# Patient Record
Sex: Female | Born: 1970
Health system: Southern US, Community
[De-identification: ages and names within clinical notes are randomized; demographics above are authoritative.]

## PROBLEM LIST (undated history)

## (undated) DIAGNOSIS — F32A Depression, unspecified: Secondary | ICD-10-CM

## (undated) DIAGNOSIS — I10 Essential (primary) hypertension: Secondary | ICD-10-CM

## (undated) DIAGNOSIS — M5412 Radiculopathy, cervical region: Secondary | ICD-10-CM

## (undated) DIAGNOSIS — E785 Hyperlipidemia, unspecified: Secondary | ICD-10-CM

## (undated) DIAGNOSIS — G5600 Carpal tunnel syndrome, unspecified upper limb: Secondary | ICD-10-CM

## (undated) DIAGNOSIS — J45909 Unspecified asthma, uncomplicated: Secondary | ICD-10-CM

## (undated) DIAGNOSIS — M5106 Intervertebral disc disorders with myelopathy, lumbar region: Secondary | ICD-10-CM

## (undated) DIAGNOSIS — K219 Gastro-esophageal reflux disease without esophagitis: Secondary | ICD-10-CM

## (undated) DIAGNOSIS — G894 Chronic pain syndrome: Secondary | ICD-10-CM

## (undated) DIAGNOSIS — M1990 Unspecified osteoarthritis, unspecified site: Secondary | ICD-10-CM

## (undated) DIAGNOSIS — E781 Pure hyperglyceridemia: Secondary | ICD-10-CM

## (undated) DIAGNOSIS — E119 Type 2 diabetes mellitus without complications: Secondary | ICD-10-CM

## (undated) DIAGNOSIS — G4733 Obstructive sleep apnea (adult) (pediatric): Secondary | ICD-10-CM

## (undated) HISTORY — DX: Obstructive sleep apnea (adult) (pediatric): G47.33

## (undated) HISTORY — DX: Hyperlipidemia, unspecified: E78.5

## (undated) HISTORY — DX: Chronic pain syndrome: G89.4

## (undated) HISTORY — DX: Depression, unspecified: F32.A

## (undated) HISTORY — DX: Gastro-esophageal reflux disease without esophagitis: K21.9

## (undated) HISTORY — DX: Type 2 diabetes mellitus without complications: E11.9

## (undated) HISTORY — DX: Unspecified osteoarthritis, unspecified site: M19.90

## (undated) HISTORY — PX: CHOLECYSTECTOMY: SHX55

## (undated) HISTORY — DX: Unspecified asthma, uncomplicated: J45.909

## (undated) HISTORY — DX: Essential (primary) hypertension: I10

## (undated) HISTORY — PX: KNEE SURGERY: SHX244

## (undated) HISTORY — DX: Intervertebral disc disorders with myelopathy, lumbar region: M51.06

## (undated) HISTORY — DX: Radiculopathy, cervical region: M54.12

## (undated) HISTORY — DX: Pure hyperglyceridemia: E78.1

## (undated) HISTORY — DX: Carpal tunnel syndrome, unspecified upper limb: G56.00

## (undated) HISTORY — PX: UTERINE FIBROID SURGERY: SHX826

---

## 2008-09-19 ENCOUNTER — Encounter
Admission: RE | Admit: 2008-09-19 | Discharge: 2008-09-19 | Payer: Self-pay | Admitting: Physical Medicine & Rehabilitation

## 2008-11-23 DIAGNOSIS — M5412 Radiculopathy, cervical region: Secondary | ICD-10-CM

## 2008-11-23 DIAGNOSIS — M5106 Intervertebral disc disorders with myelopathy, lumbar region: Secondary | ICD-10-CM

## 2008-11-23 DIAGNOSIS — G5600 Carpal tunnel syndrome, unspecified upper limb: Secondary | ICD-10-CM

## 2008-11-23 HISTORY — DX: Intervertebral disc disorders with myelopathy, lumbar region: M51.06

## 2008-11-23 HISTORY — DX: Carpal tunnel syndrome, unspecified upper limb: G56.00

## 2008-11-23 HISTORY — DX: Radiculopathy, cervical region: M54.12

## 2011-01-22 ENCOUNTER — Other Ambulatory Visit (HOSPITAL_COMMUNITY): Payer: Medicare Other

## 2011-01-24 ENCOUNTER — Inpatient Hospital Stay (HOSPITAL_COMMUNITY): Admission: RE | Admit: 2011-01-24 | Discharge: 2011-01-24 | Payer: Medicare Other | Source: Ambulatory Visit

## 2011-01-24 NOTE — Pre-Procedure Instructions (Signed)
20 Sarah Maldonado  01/24/2011   Your procedure is scheduled on: November 9th  Friday                                                                             Report to [facility] Short Stay Center at 5:30 AM.  Call this number if you have problems the morning of surgery: [phone]   Remember:   Do not eat food:After Midnight. Thursday  Do not drink clear liquids: 4 Hours before arrival (1:30am).  Take these medicines the morning of surgery with A SIP OF WATER:  Clonidine,              Maxidone and flexeril   Do not wear jewelry, make-up or nail polish .  Do not wear lotions, powders, or perfumes. You may wear deodorant.   Do not shave 48 hours prior to surgery.   Do not bring valuables to the hospital.   Contacts, dentures or bridgework may not be worn into surgery.   Leave suitcase in the car. After surgery it may be brought to your room.  For patients admitted to the hospital, checkout time is 11:00 AM the day of discharge.   Patients discharged the day of surgery will not be allowed to drive home.   Name and phone number of your driver: family  Special Instructions: CHG Shower Use Special Wash: 1/2 bottle night before surgery and 1/2 bottle morning of surgery...  Please read over the following fact sheets that you were given: Pain Booklet and MRSA Information

## 2011-01-29 ENCOUNTER — Inpatient Hospital Stay (HOSPITAL_COMMUNITY): Admission: RE | Admit: 2011-01-29 | Discharge: 2011-01-29 | Payer: Medicare Other | Source: Ambulatory Visit

## 2011-01-29 NOTE — Pre-Procedure Instructions (Signed)
20 Sarah Maldonado  01/29/2011   Your procedure is scheduled on: November 9TH--Friday                                                 Report to [facility] at 5:30 AM.  Call this number if you have problems the morning of surgery: [phone]   Remember:   Do not eat food:After Midnight.THURSDAY  Do not drink clear liquids: 4 Hours before arrival (1:30AM).  Take these medicines the morning of surgery with A SIP OF WATER: CLONIDINE, HYDROCODONE  Do not wear jewelry, make-up or nail polish.   Do not wear lotions, powders, or perfumes. You may wear deodorant.   Do not shave 48 hours prior to surgery.   Do not bring valuables to the hospital.   Contacts, dentures or bridgework may not be worn into surgery.  Leave suitcase in the car. After surgery it may be brought to your room.  For patients admitted to the hospital, checkout time is 11:00 AM the day of discharge.   Patients discharged the day of surgery will not be allowed to drive home.  Name and phone number of your driver:  family Special Instructions: CHG Shower Use Special Wash: 1/2 bottle night before surgery and 1/2 bottle morning of surgery.   Please read over the following fact sheets that you were given: Pain Booklet, MRSA Information and Surgical Site Infection Prevention

## 2011-02-01 ENCOUNTER — Ambulatory Visit (HOSPITAL_COMMUNITY): Admission: RE | Admit: 2011-02-01 | Payer: Medicare Other | Source: Ambulatory Visit | Admitting: Neurosurgery

## 2011-02-01 ENCOUNTER — Encounter (HOSPITAL_COMMUNITY): Admission: RE | Payer: Self-pay | Source: Ambulatory Visit

## 2011-02-01 SURGERY — LUMBAR LAMINECTOMY/DECOMPRESSION MICRODISCECTOMY
Anesthesia: General | Laterality: Right

## 2011-03-27 DIAGNOSIS — M5137 Other intervertebral disc degeneration, lumbosacral region: Secondary | ICD-10-CM | POA: Diagnosis not present

## 2011-04-10 DIAGNOSIS — M5137 Other intervertebral disc degeneration, lumbosacral region: Secondary | ICD-10-CM | POA: Diagnosis not present

## 2011-04-10 DIAGNOSIS — IMO0002 Reserved for concepts with insufficient information to code with codable children: Secondary | ICD-10-CM | POA: Diagnosis not present

## 2011-05-03 DIAGNOSIS — M549 Dorsalgia, unspecified: Secondary | ICD-10-CM | POA: Diagnosis not present

## 2011-05-03 DIAGNOSIS — I10 Essential (primary) hypertension: Secondary | ICD-10-CM | POA: Diagnosis not present

## 2011-05-03 DIAGNOSIS — E669 Obesity, unspecified: Secondary | ICD-10-CM | POA: Diagnosis not present

## 2011-05-03 DIAGNOSIS — E119 Type 2 diabetes mellitus without complications: Secondary | ICD-10-CM | POA: Diagnosis not present

## 2011-05-06 DIAGNOSIS — E119 Type 2 diabetes mellitus without complications: Secondary | ICD-10-CM | POA: Diagnosis not present

## 2011-05-06 DIAGNOSIS — I10 Essential (primary) hypertension: Secondary | ICD-10-CM | POA: Diagnosis not present

## 2011-05-06 DIAGNOSIS — G40909 Epilepsy, unspecified, not intractable, without status epilepticus: Secondary | ICD-10-CM | POA: Diagnosis not present

## 2011-05-06 DIAGNOSIS — R079 Chest pain, unspecified: Secondary | ICD-10-CM | POA: Diagnosis not present

## 2011-05-14 DIAGNOSIS — M25569 Pain in unspecified knee: Secondary | ICD-10-CM | POA: Diagnosis not present

## 2011-05-15 DIAGNOSIS — N949 Unspecified condition associated with female genital organs and menstrual cycle: Secondary | ICD-10-CM | POA: Diagnosis not present

## 2011-05-15 DIAGNOSIS — Z8739 Personal history of other diseases of the musculoskeletal system and connective tissue: Secondary | ICD-10-CM | POA: Diagnosis not present

## 2011-05-15 DIAGNOSIS — E119 Type 2 diabetes mellitus without complications: Secondary | ICD-10-CM | POA: Diagnosis not present

## 2011-05-15 DIAGNOSIS — I10 Essential (primary) hypertension: Secondary | ICD-10-CM | POA: Diagnosis not present

## 2011-05-15 DIAGNOSIS — N925 Other specified irregular menstruation: Secondary | ICD-10-CM | POA: Diagnosis not present

## 2011-05-15 DIAGNOSIS — M549 Dorsalgia, unspecified: Secondary | ICD-10-CM | POA: Diagnosis not present

## 2011-05-15 DIAGNOSIS — E7800 Pure hypercholesterolemia, unspecified: Secondary | ICD-10-CM | POA: Diagnosis not present

## 2011-05-15 DIAGNOSIS — Z79899 Other long term (current) drug therapy: Secondary | ICD-10-CM | POA: Diagnosis not present

## 2011-05-15 DIAGNOSIS — R5381 Other malaise: Secondary | ICD-10-CM | POA: Diagnosis not present

## 2011-06-04 DIAGNOSIS — M549 Dorsalgia, unspecified: Secondary | ICD-10-CM | POA: Diagnosis not present

## 2011-07-05 DIAGNOSIS — E785 Hyperlipidemia, unspecified: Secondary | ICD-10-CM | POA: Diagnosis not present

## 2011-07-05 DIAGNOSIS — I10 Essential (primary) hypertension: Secondary | ICD-10-CM | POA: Diagnosis not present

## 2011-07-05 DIAGNOSIS — M549 Dorsalgia, unspecified: Secondary | ICD-10-CM | POA: Diagnosis not present

## 2011-07-05 DIAGNOSIS — E119 Type 2 diabetes mellitus without complications: Secondary | ICD-10-CM | POA: Diagnosis not present

## 2011-07-05 DIAGNOSIS — E669 Obesity, unspecified: Secondary | ICD-10-CM | POA: Diagnosis not present

## 2011-08-01 DIAGNOSIS — M5126 Other intervertebral disc displacement, lumbar region: Secondary | ICD-10-CM | POA: Diagnosis not present

## 2011-08-06 ENCOUNTER — Other Ambulatory Visit: Payer: Self-pay | Admitting: Neurosurgery

## 2011-08-06 DIAGNOSIS — M5126 Other intervertebral disc displacement, lumbar region: Secondary | ICD-10-CM

## 2011-08-14 ENCOUNTER — Inpatient Hospital Stay: Admission: RE | Admit: 2011-08-14 | Payer: Medicare Other | Source: Ambulatory Visit

## 2011-08-17 ENCOUNTER — Inpatient Hospital Stay: Admission: RE | Admit: 2011-08-17 | Payer: Medicare Other | Source: Ambulatory Visit

## 2011-09-04 DIAGNOSIS — M549 Dorsalgia, unspecified: Secondary | ICD-10-CM | POA: Diagnosis not present

## 2011-09-04 DIAGNOSIS — E669 Obesity, unspecified: Secondary | ICD-10-CM | POA: Diagnosis not present

## 2011-09-04 DIAGNOSIS — I10 Essential (primary) hypertension: Secondary | ICD-10-CM | POA: Diagnosis not present

## 2011-09-04 DIAGNOSIS — E119 Type 2 diabetes mellitus without complications: Secondary | ICD-10-CM | POA: Diagnosis not present

## 2011-09-11 DIAGNOSIS — E119 Type 2 diabetes mellitus without complications: Secondary | ICD-10-CM | POA: Diagnosis not present

## 2011-09-11 DIAGNOSIS — I10 Essential (primary) hypertension: Secondary | ICD-10-CM | POA: Diagnosis not present

## 2011-09-13 DIAGNOSIS — Z1231 Encounter for screening mammogram for malignant neoplasm of breast: Secondary | ICD-10-CM | POA: Diagnosis not present

## 2011-11-03 DIAGNOSIS — E119 Type 2 diabetes mellitus without complications: Secondary | ICD-10-CM | POA: Diagnosis not present

## 2011-11-03 DIAGNOSIS — IMO0002 Reserved for concepts with insufficient information to code with codable children: Secondary | ICD-10-CM | POA: Diagnosis not present

## 2011-11-03 DIAGNOSIS — R296 Repeated falls: Secondary | ICD-10-CM | POA: Diagnosis not present

## 2011-11-03 DIAGNOSIS — E7800 Pure hypercholesterolemia, unspecified: Secondary | ICD-10-CM | POA: Diagnosis not present

## 2011-11-03 DIAGNOSIS — I10 Essential (primary) hypertension: Secondary | ICD-10-CM | POA: Diagnosis not present

## 2011-11-04 DIAGNOSIS — I10 Essential (primary) hypertension: Secondary | ICD-10-CM | POA: Diagnosis not present

## 2011-11-04 DIAGNOSIS — S8000XA Contusion of unspecified knee, initial encounter: Secondary | ICD-10-CM | POA: Diagnosis not present

## 2011-11-04 DIAGNOSIS — E119 Type 2 diabetes mellitus without complications: Secondary | ICD-10-CM | POA: Diagnosis not present

## 2011-11-04 DIAGNOSIS — M549 Dorsalgia, unspecified: Secondary | ICD-10-CM | POA: Diagnosis not present

## 2011-12-04 DIAGNOSIS — Z9889 Other specified postprocedural states: Secondary | ICD-10-CM | POA: Diagnosis not present

## 2011-12-04 DIAGNOSIS — M25569 Pain in unspecified knee: Secondary | ICD-10-CM | POA: Diagnosis not present

## 2011-12-25 DIAGNOSIS — T84498A Other mechanical complication of other internal orthopedic devices, implants and grafts, initial encounter: Secondary | ICD-10-CM | POA: Diagnosis not present

## 2011-12-25 DIAGNOSIS — IMO0002 Reserved for concepts with insufficient information to code with codable children: Secondary | ICD-10-CM | POA: Diagnosis not present

## 2011-12-25 DIAGNOSIS — Z6841 Body Mass Index (BMI) 40.0 and over, adult: Secondary | ICD-10-CM | POA: Diagnosis not present

## 2012-01-07 DIAGNOSIS — I10 Essential (primary) hypertension: Secondary | ICD-10-CM | POA: Diagnosis not present

## 2012-01-07 DIAGNOSIS — G894 Chronic pain syndrome: Secondary | ICD-10-CM | POA: Diagnosis not present

## 2012-01-14 DIAGNOSIS — I10 Essential (primary) hypertension: Secondary | ICD-10-CM | POA: Diagnosis not present

## 2012-01-22 DIAGNOSIS — E119 Type 2 diabetes mellitus without complications: Secondary | ICD-10-CM | POA: Diagnosis not present

## 2012-01-22 DIAGNOSIS — S83289A Other tear of lateral meniscus, current injury, unspecified knee, initial encounter: Secondary | ICD-10-CM | POA: Diagnosis not present

## 2012-01-22 DIAGNOSIS — M6580 Other synovitis and tenosynovitis, unspecified site: Secondary | ICD-10-CM | POA: Diagnosis not present

## 2012-01-22 DIAGNOSIS — Z79899 Other long term (current) drug therapy: Secondary | ICD-10-CM | POA: Diagnosis not present

## 2012-01-22 DIAGNOSIS — Z0389 Encounter for observation for other suspected diseases and conditions ruled out: Secondary | ICD-10-CM | POA: Diagnosis not present

## 2012-01-22 DIAGNOSIS — G4730 Sleep apnea, unspecified: Secondary | ICD-10-CM | POA: Diagnosis not present

## 2012-01-22 DIAGNOSIS — T84498A Other mechanical complication of other internal orthopedic devices, implants and grafts, initial encounter: Secondary | ICD-10-CM | POA: Diagnosis not present

## 2012-01-22 DIAGNOSIS — I10 Essential (primary) hypertension: Secondary | ICD-10-CM | POA: Diagnosis not present

## 2012-01-24 DIAGNOSIS — Z01810 Encounter for preprocedural cardiovascular examination: Secondary | ICD-10-CM | POA: Diagnosis not present

## 2012-01-24 DIAGNOSIS — E119 Type 2 diabetes mellitus without complications: Secondary | ICD-10-CM | POA: Diagnosis not present

## 2012-01-24 DIAGNOSIS — Z832 Family history of diseases of the blood and blood-forming organs and certain disorders involving the immune mechanism: Secondary | ICD-10-CM | POA: Diagnosis not present

## 2012-01-24 DIAGNOSIS — R9431 Abnormal electrocardiogram [ECG] [EKG]: Secondary | ICD-10-CM | POA: Diagnosis not present

## 2012-02-04 DIAGNOSIS — E109 Type 1 diabetes mellitus without complications: Secondary | ICD-10-CM | POA: Diagnosis not present

## 2012-02-04 DIAGNOSIS — E119 Type 2 diabetes mellitus without complications: Secondary | ICD-10-CM | POA: Diagnosis not present

## 2012-02-10 DIAGNOSIS — IMO0002 Reserved for concepts with insufficient information to code with codable children: Secondary | ICD-10-CM | POA: Diagnosis not present

## 2012-02-10 DIAGNOSIS — M25569 Pain in unspecified knee: Secondary | ICD-10-CM | POA: Diagnosis not present

## 2012-02-10 DIAGNOSIS — T84498A Other mechanical complication of other internal orthopedic devices, implants and grafts, initial encounter: Secondary | ICD-10-CM | POA: Diagnosis not present

## 2012-02-11 DIAGNOSIS — I10 Essential (primary) hypertension: Secondary | ICD-10-CM | POA: Diagnosis not present

## 2012-02-11 DIAGNOSIS — G4730 Sleep apnea, unspecified: Secondary | ICD-10-CM | POA: Diagnosis not present

## 2012-02-11 DIAGNOSIS — S83289A Other tear of lateral meniscus, current injury, unspecified knee, initial encounter: Secondary | ICD-10-CM | POA: Diagnosis not present

## 2012-02-11 DIAGNOSIS — M6580 Other synovitis and tenosynovitis, unspecified site: Secondary | ICD-10-CM | POA: Diagnosis not present

## 2012-02-11 DIAGNOSIS — E119 Type 2 diabetes mellitus without complications: Secondary | ICD-10-CM | POA: Diagnosis not present

## 2012-02-11 DIAGNOSIS — T84498A Other mechanical complication of other internal orthopedic devices, implants and grafts, initial encounter: Secondary | ICD-10-CM | POA: Diagnosis not present

## 2012-02-11 DIAGNOSIS — M659 Synovitis and tenosynovitis, unspecified: Secondary | ICD-10-CM | POA: Diagnosis not present

## 2012-02-12 DIAGNOSIS — I10 Essential (primary) hypertension: Secondary | ICD-10-CM | POA: Diagnosis not present

## 2012-02-12 DIAGNOSIS — S83289A Other tear of lateral meniscus, current injury, unspecified knee, initial encounter: Secondary | ICD-10-CM | POA: Diagnosis not present

## 2012-02-12 DIAGNOSIS — T84498A Other mechanical complication of other internal orthopedic devices, implants and grafts, initial encounter: Secondary | ICD-10-CM | POA: Diagnosis not present

## 2012-02-12 DIAGNOSIS — M6580 Other synovitis and tenosynovitis, unspecified site: Secondary | ICD-10-CM | POA: Diagnosis not present

## 2012-02-12 DIAGNOSIS — E119 Type 2 diabetes mellitus without complications: Secondary | ICD-10-CM | POA: Diagnosis not present

## 2012-02-12 DIAGNOSIS — G4730 Sleep apnea, unspecified: Secondary | ICD-10-CM | POA: Diagnosis not present

## 2012-02-14 DIAGNOSIS — IMO0002 Reserved for concepts with insufficient information to code with codable children: Secondary | ICD-10-CM | POA: Diagnosis not present

## 2012-02-14 DIAGNOSIS — M25569 Pain in unspecified knee: Secondary | ICD-10-CM | POA: Diagnosis not present

## 2012-02-14 DIAGNOSIS — IMO0001 Reserved for inherently not codable concepts without codable children: Secondary | ICD-10-CM | POA: Diagnosis not present

## 2012-02-27 DIAGNOSIS — R109 Unspecified abdominal pain: Secondary | ICD-10-CM | POA: Diagnosis not present

## 2012-02-27 DIAGNOSIS — I10 Essential (primary) hypertension: Secondary | ICD-10-CM | POA: Diagnosis not present

## 2012-02-27 DIAGNOSIS — N200 Calculus of kidney: Secondary | ICD-10-CM | POA: Diagnosis not present

## 2012-02-27 DIAGNOSIS — N23 Unspecified renal colic: Secondary | ICD-10-CM | POA: Diagnosis not present

## 2012-02-27 DIAGNOSIS — R1032 Left lower quadrant pain: Secondary | ICD-10-CM | POA: Diagnosis not present

## 2012-02-28 DIAGNOSIS — N390 Urinary tract infection, site not specified: Secondary | ICD-10-CM | POA: Diagnosis not present

## 2012-02-28 DIAGNOSIS — N201 Calculus of ureter: Secondary | ICD-10-CM | POA: Diagnosis not present

## 2012-02-29 DIAGNOSIS — R935 Abnormal findings on diagnostic imaging of other abdominal regions, including retroperitoneum: Secondary | ICD-10-CM | POA: Diagnosis not present

## 2012-03-09 DIAGNOSIS — M129 Arthropathy, unspecified: Secondary | ICD-10-CM | POA: Diagnosis not present

## 2012-03-09 DIAGNOSIS — E119 Type 2 diabetes mellitus without complications: Secondary | ICD-10-CM | POA: Diagnosis not present

## 2012-03-09 DIAGNOSIS — G894 Chronic pain syndrome: Secondary | ICD-10-CM | POA: Diagnosis not present

## 2012-03-09 DIAGNOSIS — N390 Urinary tract infection, site not specified: Secondary | ICD-10-CM | POA: Diagnosis not present

## 2012-03-09 DIAGNOSIS — N201 Calculus of ureter: Secondary | ICD-10-CM | POA: Diagnosis not present

## 2012-03-09 DIAGNOSIS — I10 Essential (primary) hypertension: Secondary | ICD-10-CM | POA: Diagnosis not present

## 2012-04-09 DIAGNOSIS — G894 Chronic pain syndrome: Secondary | ICD-10-CM | POA: Diagnosis not present

## 2012-05-12 DIAGNOSIS — J45909 Unspecified asthma, uncomplicated: Secondary | ICD-10-CM | POA: Diagnosis not present

## 2012-05-12 DIAGNOSIS — E119 Type 2 diabetes mellitus without complications: Secondary | ICD-10-CM | POA: Diagnosis not present

## 2012-05-12 DIAGNOSIS — G43909 Migraine, unspecified, not intractable, without status migrainosus: Secondary | ICD-10-CM | POA: Diagnosis not present

## 2012-05-12 DIAGNOSIS — N23 Unspecified renal colic: Secondary | ICD-10-CM | POA: Diagnosis not present

## 2012-05-12 DIAGNOSIS — E7800 Pure hypercholesterolemia, unspecified: Secondary | ICD-10-CM | POA: Diagnosis not present

## 2012-05-12 DIAGNOSIS — R319 Hematuria, unspecified: Secondary | ICD-10-CM | POA: Diagnosis not present

## 2012-05-12 DIAGNOSIS — I10 Essential (primary) hypertension: Secondary | ICD-10-CM | POA: Diagnosis not present

## 2012-05-12 DIAGNOSIS — N200 Calculus of kidney: Secondary | ICD-10-CM | POA: Diagnosis not present

## 2012-05-12 DIAGNOSIS — R112 Nausea with vomiting, unspecified: Secondary | ICD-10-CM | POA: Diagnosis not present

## 2012-05-12 DIAGNOSIS — R1032 Left lower quadrant pain: Secondary | ICD-10-CM | POA: Diagnosis not present

## 2012-05-14 DIAGNOSIS — E669 Obesity, unspecified: Secondary | ICD-10-CM | POA: Diagnosis not present

## 2012-06-12 DIAGNOSIS — G894 Chronic pain syndrome: Secondary | ICD-10-CM | POA: Diagnosis not present

## 2012-07-13 DIAGNOSIS — E783 Hyperchylomicronemia: Secondary | ICD-10-CM | POA: Diagnosis not present

## 2012-07-13 DIAGNOSIS — E119 Type 2 diabetes mellitus without complications: Secondary | ICD-10-CM | POA: Diagnosis not present

## 2012-07-13 DIAGNOSIS — E785 Hyperlipidemia, unspecified: Secondary | ICD-10-CM | POA: Diagnosis not present

## 2012-07-13 DIAGNOSIS — I10 Essential (primary) hypertension: Secondary | ICD-10-CM | POA: Diagnosis not present

## 2012-07-13 DIAGNOSIS — G894 Chronic pain syndrome: Secondary | ICD-10-CM | POA: Diagnosis not present

## 2012-07-14 DIAGNOSIS — E119 Type 2 diabetes mellitus without complications: Secondary | ICD-10-CM | POA: Diagnosis not present

## 2012-07-14 DIAGNOSIS — N200 Calculus of kidney: Secondary | ICD-10-CM | POA: Diagnosis not present

## 2012-07-14 DIAGNOSIS — I10 Essential (primary) hypertension: Secondary | ICD-10-CM | POA: Diagnosis not present

## 2012-07-14 DIAGNOSIS — E7800 Pure hypercholesterolemia, unspecified: Secondary | ICD-10-CM | POA: Diagnosis not present

## 2012-07-14 DIAGNOSIS — J45909 Unspecified asthma, uncomplicated: Secondary | ICD-10-CM | POA: Diagnosis not present

## 2012-07-14 DIAGNOSIS — R109 Unspecified abdominal pain: Secondary | ICD-10-CM | POA: Diagnosis not present

## 2012-07-14 DIAGNOSIS — Z79899 Other long term (current) drug therapy: Secondary | ICD-10-CM | POA: Diagnosis not present

## 2012-09-09 DIAGNOSIS — I10 Essential (primary) hypertension: Secondary | ICD-10-CM | POA: Diagnosis not present

## 2012-09-09 DIAGNOSIS — E119 Type 2 diabetes mellitus without complications: Secondary | ICD-10-CM | POA: Diagnosis not present

## 2012-09-09 DIAGNOSIS — E785 Hyperlipidemia, unspecified: Secondary | ICD-10-CM | POA: Diagnosis not present

## 2012-09-09 DIAGNOSIS — G894 Chronic pain syndrome: Secondary | ICD-10-CM | POA: Diagnosis not present

## 2012-09-16 DIAGNOSIS — M1990 Unspecified osteoarthritis, unspecified site: Secondary | ICD-10-CM | POA: Diagnosis not present

## 2012-09-16 DIAGNOSIS — E119 Type 2 diabetes mellitus without complications: Secondary | ICD-10-CM | POA: Diagnosis not present

## 2012-09-16 DIAGNOSIS — I10 Essential (primary) hypertension: Secondary | ICD-10-CM | POA: Diagnosis not present

## 2012-09-16 DIAGNOSIS — F329 Major depressive disorder, single episode, unspecified: Secondary | ICD-10-CM | POA: Diagnosis not present

## 2012-09-17 DIAGNOSIS — I10 Essential (primary) hypertension: Secondary | ICD-10-CM | POA: Diagnosis not present

## 2012-09-17 DIAGNOSIS — E119 Type 2 diabetes mellitus without complications: Secondary | ICD-10-CM | POA: Diagnosis not present

## 2012-10-13 DIAGNOSIS — I10 Essential (primary) hypertension: Secondary | ICD-10-CM | POA: Diagnosis not present

## 2012-10-13 DIAGNOSIS — E119 Type 2 diabetes mellitus without complications: Secondary | ICD-10-CM | POA: Diagnosis not present

## 2012-10-13 DIAGNOSIS — Z1231 Encounter for screening mammogram for malignant neoplasm of breast: Secondary | ICD-10-CM | POA: Diagnosis not present

## 2012-10-13 DIAGNOSIS — G894 Chronic pain syndrome: Secondary | ICD-10-CM | POA: Diagnosis not present

## 2012-10-28 DIAGNOSIS — N949 Unspecified condition associated with female genital organs and menstrual cycle: Secondary | ICD-10-CM | POA: Diagnosis not present

## 2012-10-28 DIAGNOSIS — N946 Dysmenorrhea, unspecified: Secondary | ICD-10-CM | POA: Diagnosis not present

## 2012-11-05 DIAGNOSIS — E119 Type 2 diabetes mellitus without complications: Secondary | ICD-10-CM | POA: Diagnosis not present

## 2012-11-05 DIAGNOSIS — K5909 Other constipation: Secondary | ICD-10-CM | POA: Diagnosis not present

## 2012-11-05 DIAGNOSIS — J45909 Unspecified asthma, uncomplicated: Secondary | ICD-10-CM | POA: Diagnosis not present

## 2012-11-05 DIAGNOSIS — R110 Nausea: Secondary | ICD-10-CM | POA: Diagnosis not present

## 2012-11-05 DIAGNOSIS — R1032 Left lower quadrant pain: Secondary | ICD-10-CM | POA: Diagnosis not present

## 2012-11-05 DIAGNOSIS — K5900 Constipation, unspecified: Secondary | ICD-10-CM | POA: Diagnosis not present

## 2012-11-05 DIAGNOSIS — I10 Essential (primary) hypertension: Secondary | ICD-10-CM | POA: Diagnosis not present

## 2012-11-05 DIAGNOSIS — E7800 Pure hypercholesterolemia, unspecified: Secondary | ICD-10-CM | POA: Diagnosis not present

## 2012-11-05 DIAGNOSIS — N23 Unspecified renal colic: Secondary | ICD-10-CM | POA: Diagnosis not present

## 2012-11-12 DIAGNOSIS — N946 Dysmenorrhea, unspecified: Secondary | ICD-10-CM | POA: Diagnosis not present

## 2012-11-12 DIAGNOSIS — N926 Irregular menstruation, unspecified: Secondary | ICD-10-CM | POA: Diagnosis not present

## 2012-11-12 DIAGNOSIS — N949 Unspecified condition associated with female genital organs and menstrual cycle: Secondary | ICD-10-CM | POA: Diagnosis not present

## 2012-11-16 DIAGNOSIS — I10 Essential (primary) hypertension: Secondary | ICD-10-CM | POA: Diagnosis not present

## 2012-11-16 DIAGNOSIS — G894 Chronic pain syndrome: Secondary | ICD-10-CM | POA: Diagnosis not present

## 2012-11-25 DIAGNOSIS — N949 Unspecified condition associated with female genital organs and menstrual cycle: Secondary | ICD-10-CM | POA: Diagnosis not present

## 2012-11-25 DIAGNOSIS — N946 Dysmenorrhea, unspecified: Secondary | ICD-10-CM | POA: Diagnosis not present

## 2012-12-01 DIAGNOSIS — G4710 Hypersomnia, unspecified: Secondary | ICD-10-CM | POA: Diagnosis not present

## 2012-12-01 DIAGNOSIS — J310 Chronic rhinitis: Secondary | ICD-10-CM | POA: Diagnosis not present

## 2012-12-01 DIAGNOSIS — R0609 Other forms of dyspnea: Secondary | ICD-10-CM | POA: Diagnosis not present

## 2012-12-01 DIAGNOSIS — G4730 Sleep apnea, unspecified: Secondary | ICD-10-CM | POA: Diagnosis not present

## 2012-12-02 DIAGNOSIS — G4710 Hypersomnia, unspecified: Secondary | ICD-10-CM | POA: Diagnosis not present

## 2012-12-02 DIAGNOSIS — J3089 Other allergic rhinitis: Secondary | ICD-10-CM | POA: Diagnosis not present

## 2012-12-02 DIAGNOSIS — Z006 Encounter for examination for normal comparison and control in clinical research program: Secondary | ICD-10-CM | POA: Diagnosis not present

## 2012-12-02 DIAGNOSIS — R0609 Other forms of dyspnea: Secondary | ICD-10-CM | POA: Diagnosis not present

## 2012-12-04 DIAGNOSIS — G4710 Hypersomnia, unspecified: Secondary | ICD-10-CM | POA: Diagnosis not present

## 2012-12-14 DIAGNOSIS — G894 Chronic pain syndrome: Secondary | ICD-10-CM | POA: Diagnosis not present

## 2012-12-14 DIAGNOSIS — E119 Type 2 diabetes mellitus without complications: Secondary | ICD-10-CM | POA: Diagnosis not present

## 2012-12-14 DIAGNOSIS — I10 Essential (primary) hypertension: Secondary | ICD-10-CM | POA: Diagnosis not present

## 2012-12-16 DIAGNOSIS — J310 Chronic rhinitis: Secondary | ICD-10-CM | POA: Diagnosis not present

## 2012-12-16 DIAGNOSIS — R0609 Other forms of dyspnea: Secondary | ICD-10-CM | POA: Diagnosis not present

## 2012-12-16 DIAGNOSIS — G4730 Sleep apnea, unspecified: Secondary | ICD-10-CM | POA: Diagnosis not present

## 2012-12-16 DIAGNOSIS — G4710 Hypersomnia, unspecified: Secondary | ICD-10-CM | POA: Diagnosis not present

## 2012-12-17 DIAGNOSIS — N949 Unspecified condition associated with female genital organs and menstrual cycle: Secondary | ICD-10-CM | POA: Diagnosis not present

## 2012-12-17 DIAGNOSIS — N946 Dysmenorrhea, unspecified: Secondary | ICD-10-CM | POA: Diagnosis not present

## 2012-12-21 DIAGNOSIS — I10 Essential (primary) hypertension: Secondary | ICD-10-CM | POA: Diagnosis not present

## 2012-12-23 DIAGNOSIS — J310 Chronic rhinitis: Secondary | ICD-10-CM | POA: Diagnosis not present

## 2013-01-01 DIAGNOSIS — G4710 Hypersomnia, unspecified: Secondary | ICD-10-CM | POA: Diagnosis not present

## 2013-01-06 DIAGNOSIS — G4710 Hypersomnia, unspecified: Secondary | ICD-10-CM | POA: Diagnosis not present

## 2013-01-06 DIAGNOSIS — J310 Chronic rhinitis: Secondary | ICD-10-CM | POA: Diagnosis not present

## 2013-01-06 DIAGNOSIS — R0609 Other forms of dyspnea: Secondary | ICD-10-CM | POA: Diagnosis not present

## 2013-01-13 DIAGNOSIS — F339 Major depressive disorder, recurrent, unspecified: Secondary | ICD-10-CM | POA: Diagnosis not present

## 2013-01-13 DIAGNOSIS — I10 Essential (primary) hypertension: Secondary | ICD-10-CM | POA: Diagnosis not present

## 2013-01-13 DIAGNOSIS — E119 Type 2 diabetes mellitus without complications: Secondary | ICD-10-CM | POA: Diagnosis not present

## 2013-01-13 DIAGNOSIS — G894 Chronic pain syndrome: Secondary | ICD-10-CM | POA: Diagnosis not present

## 2013-01-18 DIAGNOSIS — I10 Essential (primary) hypertension: Secondary | ICD-10-CM | POA: Diagnosis not present

## 2013-01-18 DIAGNOSIS — F341 Dysthymic disorder: Secondary | ICD-10-CM | POA: Diagnosis not present

## 2013-01-18 DIAGNOSIS — Z01810 Encounter for preprocedural cardiovascular examination: Secondary | ICD-10-CM | POA: Diagnosis not present

## 2013-01-18 DIAGNOSIS — E119 Type 2 diabetes mellitus without complications: Secondary | ICD-10-CM | POA: Diagnosis not present

## 2013-01-18 DIAGNOSIS — M1710 Unilateral primary osteoarthritis, unspecified knee: Secondary | ICD-10-CM | POA: Diagnosis not present

## 2013-01-18 DIAGNOSIS — M25569 Pain in unspecified knee: Secondary | ICD-10-CM | POA: Diagnosis not present

## 2013-01-18 DIAGNOSIS — E785 Hyperlipidemia, unspecified: Secondary | ICD-10-CM | POA: Diagnosis not present

## 2013-02-09 DIAGNOSIS — N949 Unspecified condition associated with female genital organs and menstrual cycle: Secondary | ICD-10-CM | POA: Diagnosis not present

## 2013-02-09 DIAGNOSIS — N946 Dysmenorrhea, unspecified: Secondary | ICD-10-CM | POA: Diagnosis not present

## 2013-02-15 DIAGNOSIS — R0609 Other forms of dyspnea: Secondary | ICD-10-CM | POA: Diagnosis not present

## 2013-02-15 DIAGNOSIS — R0989 Other specified symptoms and signs involving the circulatory and respiratory systems: Secondary | ICD-10-CM | POA: Diagnosis not present

## 2013-02-15 DIAGNOSIS — G4710 Hypersomnia, unspecified: Secondary | ICD-10-CM | POA: Diagnosis not present

## 2013-02-15 DIAGNOSIS — I10 Essential (primary) hypertension: Secondary | ICD-10-CM | POA: Diagnosis not present

## 2013-02-15 DIAGNOSIS — G894 Chronic pain syndrome: Secondary | ICD-10-CM | POA: Diagnosis not present

## 2013-02-15 DIAGNOSIS — G4730 Sleep apnea, unspecified: Secondary | ICD-10-CM | POA: Diagnosis not present

## 2013-02-15 DIAGNOSIS — J310 Chronic rhinitis: Secondary | ICD-10-CM | POA: Diagnosis not present

## 2013-02-15 DIAGNOSIS — E785 Hyperlipidemia, unspecified: Secondary | ICD-10-CM | POA: Diagnosis not present

## 2013-02-15 DIAGNOSIS — E119 Type 2 diabetes mellitus without complications: Secondary | ICD-10-CM | POA: Diagnosis not present

## 2013-02-16 DIAGNOSIS — G4733 Obstructive sleep apnea (adult) (pediatric): Secondary | ICD-10-CM | POA: Diagnosis not present

## 2013-02-16 DIAGNOSIS — I10 Essential (primary) hypertension: Secondary | ICD-10-CM | POA: Diagnosis not present

## 2013-02-16 DIAGNOSIS — N946 Dysmenorrhea, unspecified: Secondary | ICD-10-CM | POA: Diagnosis not present

## 2013-02-16 DIAGNOSIS — Z79899 Other long term (current) drug therapy: Secondary | ICD-10-CM | POA: Diagnosis not present

## 2013-02-16 DIAGNOSIS — N949 Unspecified condition associated with female genital organs and menstrual cycle: Secondary | ICD-10-CM | POA: Diagnosis not present

## 2013-02-16 DIAGNOSIS — E7800 Pure hypercholesterolemia, unspecified: Secondary | ICD-10-CM | POA: Diagnosis not present

## 2013-02-17 DIAGNOSIS — N949 Unspecified condition associated with female genital organs and menstrual cycle: Secondary | ICD-10-CM | POA: Diagnosis not present

## 2013-02-17 DIAGNOSIS — Z79899 Other long term (current) drug therapy: Secondary | ICD-10-CM | POA: Diagnosis not present

## 2013-02-17 DIAGNOSIS — G4733 Obstructive sleep apnea (adult) (pediatric): Secondary | ICD-10-CM | POA: Diagnosis not present

## 2013-02-17 DIAGNOSIS — J45909 Unspecified asthma, uncomplicated: Secondary | ICD-10-CM | POA: Diagnosis not present

## 2013-02-17 DIAGNOSIS — I10 Essential (primary) hypertension: Secondary | ICD-10-CM | POA: Diagnosis not present

## 2013-02-17 DIAGNOSIS — G4730 Sleep apnea, unspecified: Secondary | ICD-10-CM | POA: Diagnosis not present

## 2013-02-17 DIAGNOSIS — E7800 Pure hypercholesterolemia, unspecified: Secondary | ICD-10-CM | POA: Diagnosis not present

## 2013-03-03 DIAGNOSIS — N949 Unspecified condition associated with female genital organs and menstrual cycle: Secondary | ICD-10-CM | POA: Diagnosis not present

## 2013-03-03 DIAGNOSIS — N946 Dysmenorrhea, unspecified: Secondary | ICD-10-CM | POA: Diagnosis not present

## 2013-03-03 DIAGNOSIS — J398 Other specified diseases of upper respiratory tract: Secondary | ICD-10-CM | POA: Diagnosis not present

## 2013-03-10 DIAGNOSIS — G894 Chronic pain syndrome: Secondary | ICD-10-CM | POA: Diagnosis not present

## 2013-03-10 DIAGNOSIS — E669 Obesity, unspecified: Secondary | ICD-10-CM | POA: Diagnosis not present

## 2013-03-10 DIAGNOSIS — E119 Type 2 diabetes mellitus without complications: Secondary | ICD-10-CM | POA: Diagnosis not present

## 2013-03-10 DIAGNOSIS — I10 Essential (primary) hypertension: Secondary | ICD-10-CM | POA: Diagnosis not present

## 2013-04-09 DIAGNOSIS — Z79899 Other long term (current) drug therapy: Secondary | ICD-10-CM | POA: Diagnosis not present

## 2013-04-09 DIAGNOSIS — R059 Cough, unspecified: Secondary | ICD-10-CM | POA: Diagnosis not present

## 2013-04-09 DIAGNOSIS — G894 Chronic pain syndrome: Secondary | ICD-10-CM | POA: Diagnosis not present

## 2013-04-09 DIAGNOSIS — I10 Essential (primary) hypertension: Secondary | ICD-10-CM | POA: Diagnosis not present

## 2013-04-09 DIAGNOSIS — R05 Cough: Secondary | ICD-10-CM | POA: Diagnosis not present

## 2013-04-09 DIAGNOSIS — J209 Acute bronchitis, unspecified: Secondary | ICD-10-CM | POA: Diagnosis not present

## 2013-04-16 DIAGNOSIS — I10 Essential (primary) hypertension: Secondary | ICD-10-CM | POA: Diagnosis not present

## 2013-04-16 DIAGNOSIS — J310 Chronic rhinitis: Secondary | ICD-10-CM | POA: Diagnosis not present

## 2013-04-16 DIAGNOSIS — E119 Type 2 diabetes mellitus without complications: Secondary | ICD-10-CM | POA: Diagnosis not present

## 2013-04-16 DIAGNOSIS — E785 Hyperlipidemia, unspecified: Secondary | ICD-10-CM | POA: Diagnosis not present

## 2013-04-16 DIAGNOSIS — M129 Arthropathy, unspecified: Secondary | ICD-10-CM | POA: Diagnosis not present

## 2013-04-23 DIAGNOSIS — J310 Chronic rhinitis: Secondary | ICD-10-CM | POA: Diagnosis not present

## 2013-05-17 DIAGNOSIS — E119 Type 2 diabetes mellitus without complications: Secondary | ICD-10-CM | POA: Diagnosis not present

## 2013-05-17 DIAGNOSIS — E785 Hyperlipidemia, unspecified: Secondary | ICD-10-CM | POA: Diagnosis not present

## 2013-05-17 DIAGNOSIS — I10 Essential (primary) hypertension: Secondary | ICD-10-CM | POA: Diagnosis not present

## 2013-05-17 DIAGNOSIS — G894 Chronic pain syndrome: Secondary | ICD-10-CM | POA: Diagnosis not present

## 2013-05-27 DIAGNOSIS — R059 Cough, unspecified: Secondary | ICD-10-CM | POA: Diagnosis not present

## 2013-05-27 DIAGNOSIS — J019 Acute sinusitis, unspecified: Secondary | ICD-10-CM | POA: Diagnosis not present

## 2013-05-27 DIAGNOSIS — J218 Acute bronchiolitis due to other specified organisms: Secondary | ICD-10-CM | POA: Diagnosis not present

## 2013-05-27 DIAGNOSIS — J45901 Unspecified asthma with (acute) exacerbation: Secondary | ICD-10-CM | POA: Diagnosis not present

## 2013-05-27 DIAGNOSIS — R071 Chest pain on breathing: Secondary | ICD-10-CM | POA: Diagnosis not present

## 2013-05-27 DIAGNOSIS — R05 Cough: Secondary | ICD-10-CM | POA: Diagnosis not present

## 2013-05-28 DIAGNOSIS — I10 Essential (primary) hypertension: Secondary | ICD-10-CM | POA: Diagnosis not present

## 2013-06-05 DIAGNOSIS — R569 Unspecified convulsions: Secondary | ICD-10-CM | POA: Diagnosis not present

## 2013-06-05 DIAGNOSIS — I10 Essential (primary) hypertension: Secondary | ICD-10-CM | POA: Diagnosis not present

## 2013-06-05 DIAGNOSIS — J9819 Other pulmonary collapse: Secondary | ICD-10-CM | POA: Diagnosis not present

## 2013-06-05 DIAGNOSIS — J209 Acute bronchitis, unspecified: Secondary | ICD-10-CM | POA: Diagnosis not present

## 2013-06-05 DIAGNOSIS — R1110 Vomiting, unspecified: Secondary | ICD-10-CM | POA: Diagnosis not present

## 2013-06-09 DIAGNOSIS — N946 Dysmenorrhea, unspecified: Secondary | ICD-10-CM | POA: Diagnosis not present

## 2013-06-09 DIAGNOSIS — N925 Other specified irregular menstruation: Secondary | ICD-10-CM | POA: Diagnosis not present

## 2013-06-09 DIAGNOSIS — N949 Unspecified condition associated with female genital organs and menstrual cycle: Secondary | ICD-10-CM | POA: Diagnosis not present

## 2013-06-14 DIAGNOSIS — R112 Nausea with vomiting, unspecified: Secondary | ICD-10-CM | POA: Diagnosis not present

## 2013-06-14 DIAGNOSIS — IMO0001 Reserved for inherently not codable concepts without codable children: Secondary | ICD-10-CM | POA: Diagnosis not present

## 2013-06-14 DIAGNOSIS — I10 Essential (primary) hypertension: Secondary | ICD-10-CM | POA: Diagnosis not present

## 2013-06-14 DIAGNOSIS — G894 Chronic pain syndrome: Secondary | ICD-10-CM | POA: Diagnosis not present

## 2013-07-14 DIAGNOSIS — I10 Essential (primary) hypertension: Secondary | ICD-10-CM | POA: Diagnosis not present

## 2013-07-14 DIAGNOSIS — G894 Chronic pain syndrome: Secondary | ICD-10-CM | POA: Diagnosis not present

## 2013-07-14 DIAGNOSIS — E119 Type 2 diabetes mellitus without complications: Secondary | ICD-10-CM | POA: Diagnosis not present

## 2013-08-13 DIAGNOSIS — I10 Essential (primary) hypertension: Secondary | ICD-10-CM | POA: Diagnosis not present

## 2013-08-13 DIAGNOSIS — G894 Chronic pain syndrome: Secondary | ICD-10-CM | POA: Diagnosis not present

## 2013-08-13 DIAGNOSIS — E119 Type 2 diabetes mellitus without complications: Secondary | ICD-10-CM | POA: Diagnosis not present

## 2013-08-23 DIAGNOSIS — N201 Calculus of ureter: Secondary | ICD-10-CM | POA: Diagnosis not present

## 2013-08-23 DIAGNOSIS — N135 Crossing vessel and stricture of ureter without hydronephrosis: Secondary | ICD-10-CM | POA: Diagnosis not present

## 2013-08-23 DIAGNOSIS — I10 Essential (primary) hypertension: Secondary | ICD-10-CM | POA: Diagnosis not present

## 2013-08-23 DIAGNOSIS — F411 Generalized anxiety disorder: Secondary | ICD-10-CM | POA: Diagnosis not present

## 2013-08-23 DIAGNOSIS — Z79899 Other long term (current) drug therapy: Secondary | ICD-10-CM | POA: Diagnosis not present

## 2013-08-23 DIAGNOSIS — E7800 Pure hypercholesterolemia, unspecified: Secondary | ICD-10-CM | POA: Diagnosis not present

## 2013-08-23 DIAGNOSIS — N200 Calculus of kidney: Secondary | ICD-10-CM | POA: Diagnosis not present

## 2013-08-23 DIAGNOSIS — R109 Unspecified abdominal pain: Secondary | ICD-10-CM | POA: Diagnosis not present

## 2013-08-23 DIAGNOSIS — J45909 Unspecified asthma, uncomplicated: Secondary | ICD-10-CM | POA: Diagnosis not present

## 2013-08-23 DIAGNOSIS — Z0389 Encounter for observation for other suspected diseases and conditions ruled out: Secondary | ICD-10-CM | POA: Diagnosis not present

## 2013-08-23 DIAGNOSIS — E119 Type 2 diabetes mellitus without complications: Secondary | ICD-10-CM | POA: Diagnosis not present

## 2013-08-23 DIAGNOSIS — N289 Disorder of kidney and ureter, unspecified: Secondary | ICD-10-CM | POA: Diagnosis not present

## 2013-08-23 DIAGNOSIS — N23 Unspecified renal colic: Secondary | ICD-10-CM | POA: Diagnosis not present

## 2013-08-24 DIAGNOSIS — N200 Calculus of kidney: Secondary | ICD-10-CM | POA: Diagnosis not present

## 2013-08-24 DIAGNOSIS — N390 Urinary tract infection, site not specified: Secondary | ICD-10-CM | POA: Diagnosis not present

## 2013-08-27 DIAGNOSIS — E119 Type 2 diabetes mellitus without complications: Secondary | ICD-10-CM | POA: Diagnosis not present

## 2013-08-27 DIAGNOSIS — J45909 Unspecified asthma, uncomplicated: Secondary | ICD-10-CM | POA: Diagnosis not present

## 2013-08-27 DIAGNOSIS — E669 Obesity, unspecified: Secondary | ICD-10-CM | POA: Diagnosis not present

## 2013-08-27 DIAGNOSIS — Z79899 Other long term (current) drug therapy: Secondary | ICD-10-CM | POA: Diagnosis not present

## 2013-08-27 DIAGNOSIS — R935 Abnormal findings on diagnostic imaging of other abdominal regions, including retroperitoneum: Secondary | ICD-10-CM | POA: Diagnosis not present

## 2013-08-27 DIAGNOSIS — I10 Essential (primary) hypertension: Secondary | ICD-10-CM | POA: Diagnosis not present

## 2013-08-27 DIAGNOSIS — N200 Calculus of kidney: Secondary | ICD-10-CM | POA: Diagnosis not present

## 2013-08-27 DIAGNOSIS — G4730 Sleep apnea, unspecified: Secondary | ICD-10-CM | POA: Diagnosis not present

## 2013-09-02 DIAGNOSIS — N200 Calculus of kidney: Secondary | ICD-10-CM | POA: Diagnosis not present

## 2013-09-02 DIAGNOSIS — N390 Urinary tract infection, site not specified: Secondary | ICD-10-CM | POA: Diagnosis not present

## 2013-09-09 DIAGNOSIS — N201 Calculus of ureter: Secondary | ICD-10-CM | POA: Diagnosis not present

## 2013-09-09 DIAGNOSIS — N200 Calculus of kidney: Secondary | ICD-10-CM | POA: Diagnosis not present

## 2013-09-09 DIAGNOSIS — N390 Urinary tract infection, site not specified: Secondary | ICD-10-CM | POA: Diagnosis not present

## 2013-09-13 DIAGNOSIS — I10 Essential (primary) hypertension: Secondary | ICD-10-CM | POA: Diagnosis not present

## 2013-09-13 DIAGNOSIS — E119 Type 2 diabetes mellitus without complications: Secondary | ICD-10-CM | POA: Diagnosis not present

## 2013-09-13 DIAGNOSIS — G894 Chronic pain syndrome: Secondary | ICD-10-CM | POA: Diagnosis not present

## 2013-09-14 DIAGNOSIS — N390 Urinary tract infection, site not specified: Secondary | ICD-10-CM | POA: Diagnosis not present

## 2013-09-14 DIAGNOSIS — N200 Calculus of kidney: Secondary | ICD-10-CM | POA: Diagnosis not present

## 2013-10-13 DIAGNOSIS — I10 Essential (primary) hypertension: Secondary | ICD-10-CM | POA: Diagnosis not present

## 2013-10-13 DIAGNOSIS — E119 Type 2 diabetes mellitus without complications: Secondary | ICD-10-CM | POA: Diagnosis not present

## 2013-10-13 DIAGNOSIS — G894 Chronic pain syndrome: Secondary | ICD-10-CM | POA: Diagnosis not present

## 2013-10-15 DIAGNOSIS — E119 Type 2 diabetes mellitus without complications: Secondary | ICD-10-CM | POA: Diagnosis not present

## 2013-11-15 DIAGNOSIS — M549 Dorsalgia, unspecified: Secondary | ICD-10-CM | POA: Diagnosis not present

## 2013-11-15 DIAGNOSIS — E119 Type 2 diabetes mellitus without complications: Secondary | ICD-10-CM | POA: Diagnosis not present

## 2013-11-15 DIAGNOSIS — Z79899 Other long term (current) drug therapy: Secondary | ICD-10-CM | POA: Diagnosis not present

## 2013-11-15 DIAGNOSIS — I10 Essential (primary) hypertension: Secondary | ICD-10-CM | POA: Diagnosis not present

## 2013-12-16 DIAGNOSIS — G894 Chronic pain syndrome: Secondary | ICD-10-CM | POA: Diagnosis not present

## 2013-12-16 DIAGNOSIS — I10 Essential (primary) hypertension: Secondary | ICD-10-CM | POA: Diagnosis not present

## 2013-12-16 DIAGNOSIS — M549 Dorsalgia, unspecified: Secondary | ICD-10-CM | POA: Diagnosis not present

## 2013-12-16 DIAGNOSIS — E669 Obesity, unspecified: Secondary | ICD-10-CM | POA: Diagnosis not present

## 2014-01-17 DIAGNOSIS — M549 Dorsalgia, unspecified: Secondary | ICD-10-CM | POA: Diagnosis not present

## 2014-01-17 DIAGNOSIS — I10 Essential (primary) hypertension: Secondary | ICD-10-CM | POA: Diagnosis not present

## 2014-01-17 DIAGNOSIS — E119 Type 2 diabetes mellitus without complications: Secondary | ICD-10-CM | POA: Diagnosis not present

## 2014-02-14 DIAGNOSIS — E119 Type 2 diabetes mellitus without complications: Secondary | ICD-10-CM | POA: Diagnosis not present

## 2014-02-14 DIAGNOSIS — G894 Chronic pain syndrome: Secondary | ICD-10-CM | POA: Diagnosis not present

## 2014-02-14 DIAGNOSIS — M549 Dorsalgia, unspecified: Secondary | ICD-10-CM | POA: Diagnosis not present

## 2014-02-14 DIAGNOSIS — E1165 Type 2 diabetes mellitus with hyperglycemia: Secondary | ICD-10-CM | POA: Diagnosis not present

## 2014-02-14 DIAGNOSIS — I10 Essential (primary) hypertension: Secondary | ICD-10-CM | POA: Diagnosis not present

## 2014-03-16 DIAGNOSIS — E119 Type 2 diabetes mellitus without complications: Secondary | ICD-10-CM | POA: Diagnosis not present

## 2014-03-16 DIAGNOSIS — M549 Dorsalgia, unspecified: Secondary | ICD-10-CM | POA: Diagnosis not present

## 2014-03-16 DIAGNOSIS — I10 Essential (primary) hypertension: Secondary | ICD-10-CM | POA: Diagnosis not present

## 2014-03-16 DIAGNOSIS — G894 Chronic pain syndrome: Secondary | ICD-10-CM | POA: Diagnosis not present

## 2014-04-09 DIAGNOSIS — J449 Chronic obstructive pulmonary disease, unspecified: Secondary | ICD-10-CM | POA: Diagnosis not present

## 2014-04-09 DIAGNOSIS — I129 Hypertensive chronic kidney disease with stage 1 through stage 4 chronic kidney disease, or unspecified chronic kidney disease: Secondary | ICD-10-CM | POA: Diagnosis not present

## 2014-04-09 DIAGNOSIS — F419 Anxiety disorder, unspecified: Secondary | ICD-10-CM | POA: Diagnosis not present

## 2014-04-09 DIAGNOSIS — F329 Major depressive disorder, single episode, unspecified: Secondary | ICD-10-CM | POA: Diagnosis not present

## 2014-04-09 DIAGNOSIS — R112 Nausea with vomiting, unspecified: Secondary | ICD-10-CM | POA: Diagnosis not present

## 2014-04-09 DIAGNOSIS — R1032 Left lower quadrant pain: Secondary | ICD-10-CM | POA: Diagnosis not present

## 2014-04-09 DIAGNOSIS — N183 Chronic kidney disease, stage 3 (moderate): Secondary | ICD-10-CM | POA: Diagnosis not present

## 2014-04-09 DIAGNOSIS — J45909 Unspecified asthma, uncomplicated: Secondary | ICD-10-CM | POA: Diagnosis not present

## 2014-04-09 DIAGNOSIS — E119 Type 2 diabetes mellitus without complications: Secondary | ICD-10-CM | POA: Diagnosis not present

## 2014-04-09 DIAGNOSIS — I10 Essential (primary) hypertension: Secondary | ICD-10-CM | POA: Diagnosis not present

## 2014-04-09 DIAGNOSIS — R197 Diarrhea, unspecified: Secondary | ICD-10-CM | POA: Diagnosis not present

## 2014-04-18 DIAGNOSIS — G894 Chronic pain syndrome: Secondary | ICD-10-CM | POA: Diagnosis not present

## 2014-04-18 DIAGNOSIS — I10 Essential (primary) hypertension: Secondary | ICD-10-CM | POA: Diagnosis not present

## 2014-04-18 DIAGNOSIS — E1165 Type 2 diabetes mellitus with hyperglycemia: Secondary | ICD-10-CM | POA: Diagnosis not present

## 2014-04-18 DIAGNOSIS — M549 Dorsalgia, unspecified: Secondary | ICD-10-CM | POA: Diagnosis not present

## 2014-04-25 DIAGNOSIS — I10 Essential (primary) hypertension: Secondary | ICD-10-CM | POA: Diagnosis not present

## 2014-05-18 DIAGNOSIS — E119 Type 2 diabetes mellitus without complications: Secondary | ICD-10-CM | POA: Diagnosis not present

## 2014-05-18 DIAGNOSIS — I10 Essential (primary) hypertension: Secondary | ICD-10-CM | POA: Diagnosis not present

## 2014-05-18 DIAGNOSIS — E780 Pure hypercholesterolemia: Secondary | ICD-10-CM | POA: Diagnosis not present

## 2014-05-18 DIAGNOSIS — E1165 Type 2 diabetes mellitus with hyperglycemia: Secondary | ICD-10-CM | POA: Diagnosis not present

## 2014-05-18 DIAGNOSIS — G894 Chronic pain syndrome: Secondary | ICD-10-CM | POA: Diagnosis not present

## 2014-05-30 DIAGNOSIS — I10 Essential (primary) hypertension: Secondary | ICD-10-CM | POA: Diagnosis not present

## 2014-06-15 DIAGNOSIS — G894 Chronic pain syndrome: Secondary | ICD-10-CM | POA: Diagnosis not present

## 2014-06-15 DIAGNOSIS — I10 Essential (primary) hypertension: Secondary | ICD-10-CM | POA: Diagnosis not present

## 2014-06-15 DIAGNOSIS — E119 Type 2 diabetes mellitus without complications: Secondary | ICD-10-CM | POA: Diagnosis not present

## 2014-07-18 DIAGNOSIS — E1165 Type 2 diabetes mellitus with hyperglycemia: Secondary | ICD-10-CM | POA: Diagnosis not present

## 2014-07-18 DIAGNOSIS — I10 Essential (primary) hypertension: Secondary | ICD-10-CM | POA: Diagnosis not present

## 2014-07-18 DIAGNOSIS — Z79899 Other long term (current) drug therapy: Secondary | ICD-10-CM | POA: Diagnosis not present

## 2014-07-18 DIAGNOSIS — G894 Chronic pain syndrome: Secondary | ICD-10-CM | POA: Diagnosis not present

## 2014-07-18 DIAGNOSIS — M549 Dorsalgia, unspecified: Secondary | ICD-10-CM | POA: Diagnosis not present

## 2014-08-17 DIAGNOSIS — G894 Chronic pain syndrome: Secondary | ICD-10-CM | POA: Diagnosis not present

## 2014-08-17 DIAGNOSIS — Z79899 Other long term (current) drug therapy: Secondary | ICD-10-CM | POA: Diagnosis not present

## 2014-08-17 DIAGNOSIS — R262 Difficulty in walking, not elsewhere classified: Secondary | ICD-10-CM | POA: Diagnosis not present

## 2014-08-17 DIAGNOSIS — I10 Essential (primary) hypertension: Secondary | ICD-10-CM | POA: Diagnosis not present

## 2014-08-17 DIAGNOSIS — E1165 Type 2 diabetes mellitus with hyperglycemia: Secondary | ICD-10-CM | POA: Diagnosis not present

## 2014-08-17 DIAGNOSIS — M549 Dorsalgia, unspecified: Secondary | ICD-10-CM | POA: Diagnosis not present

## 2014-09-16 DIAGNOSIS — I10 Essential (primary) hypertension: Secondary | ICD-10-CM | POA: Diagnosis not present

## 2014-09-16 DIAGNOSIS — M549 Dorsalgia, unspecified: Secondary | ICD-10-CM | POA: Diagnosis not present

## 2014-09-16 DIAGNOSIS — E1165 Type 2 diabetes mellitus with hyperglycemia: Secondary | ICD-10-CM | POA: Diagnosis not present

## 2014-09-16 DIAGNOSIS — Z79899 Other long term (current) drug therapy: Secondary | ICD-10-CM | POA: Diagnosis not present

## 2014-10-14 DIAGNOSIS — E1165 Type 2 diabetes mellitus with hyperglycemia: Secondary | ICD-10-CM | POA: Diagnosis not present

## 2014-10-14 DIAGNOSIS — Z6841 Body Mass Index (BMI) 40.0 and over, adult: Secondary | ICD-10-CM | POA: Diagnosis not present

## 2014-10-14 DIAGNOSIS — Z79899 Other long term (current) drug therapy: Secondary | ICD-10-CM | POA: Diagnosis not present

## 2014-10-14 DIAGNOSIS — M549 Dorsalgia, unspecified: Secondary | ICD-10-CM | POA: Diagnosis not present

## 2014-10-14 DIAGNOSIS — G894 Chronic pain syndrome: Secondary | ICD-10-CM | POA: Diagnosis not present

## 2014-10-14 DIAGNOSIS — I10 Essential (primary) hypertension: Secondary | ICD-10-CM | POA: Diagnosis not present

## 2014-10-31 DIAGNOSIS — R079 Chest pain, unspecified: Secondary | ICD-10-CM | POA: Diagnosis not present

## 2014-10-31 DIAGNOSIS — G894 Chronic pain syndrome: Secondary | ICD-10-CM | POA: Diagnosis not present

## 2014-10-31 DIAGNOSIS — M5430 Sciatica, unspecified side: Secondary | ICD-10-CM | POA: Diagnosis not present

## 2014-10-31 DIAGNOSIS — H538 Other visual disturbances: Secondary | ICD-10-CM | POA: Diagnosis not present

## 2014-10-31 DIAGNOSIS — F419 Anxiety disorder, unspecified: Secondary | ICD-10-CM | POA: Diagnosis not present

## 2014-10-31 DIAGNOSIS — I10 Essential (primary) hypertension: Secondary | ICD-10-CM | POA: Diagnosis not present

## 2014-10-31 DIAGNOSIS — Z124 Encounter for screening for malignant neoplasm of cervix: Secondary | ICD-10-CM | POA: Diagnosis not present

## 2014-10-31 DIAGNOSIS — F329 Major depressive disorder, single episode, unspecified: Secondary | ICD-10-CM | POA: Diagnosis not present

## 2014-10-31 DIAGNOSIS — E119 Type 2 diabetes mellitus without complications: Secondary | ICD-10-CM | POA: Diagnosis not present

## 2014-10-31 DIAGNOSIS — L989 Disorder of the skin and subcutaneous tissue, unspecified: Secondary | ICD-10-CM | POA: Diagnosis not present

## 2014-11-01 DIAGNOSIS — R875 Abnormal microbiological findings in specimens from female genital organs: Secondary | ICD-10-CM | POA: Diagnosis not present

## 2014-11-01 DIAGNOSIS — Z124 Encounter for screening for malignant neoplasm of cervix: Secondary | ICD-10-CM | POA: Diagnosis not present

## 2014-11-07 DIAGNOSIS — R922 Inconclusive mammogram: Secondary | ICD-10-CM | POA: Diagnosis not present

## 2014-11-07 DIAGNOSIS — N63 Unspecified lump in breast: Secondary | ICD-10-CM | POA: Diagnosis not present

## 2014-11-14 DIAGNOSIS — G894 Chronic pain syndrome: Secondary | ICD-10-CM | POA: Diagnosis not present

## 2014-11-14 DIAGNOSIS — Z79899 Other long term (current) drug therapy: Secondary | ICD-10-CM | POA: Diagnosis not present

## 2014-11-14 DIAGNOSIS — M25561 Pain in right knee: Secondary | ICD-10-CM | POA: Diagnosis not present

## 2014-11-14 DIAGNOSIS — I10 Essential (primary) hypertension: Secondary | ICD-10-CM | POA: Diagnosis not present

## 2014-11-14 DIAGNOSIS — E1165 Type 2 diabetes mellitus with hyperglycemia: Secondary | ICD-10-CM | POA: Diagnosis not present

## 2014-11-23 DIAGNOSIS — M1711 Unilateral primary osteoarthritis, right knee: Secondary | ICD-10-CM | POA: Diagnosis not present

## 2014-11-29 DIAGNOSIS — M5136 Other intervertebral disc degeneration, lumbar region: Secondary | ICD-10-CM | POA: Diagnosis not present

## 2014-11-29 DIAGNOSIS — M5126 Other intervertebral disc displacement, lumbar region: Secondary | ICD-10-CM | POA: Diagnosis not present

## 2014-12-14 DIAGNOSIS — M549 Dorsalgia, unspecified: Secondary | ICD-10-CM | POA: Diagnosis not present

## 2014-12-14 DIAGNOSIS — E1165 Type 2 diabetes mellitus with hyperglycemia: Secondary | ICD-10-CM | POA: Diagnosis not present

## 2014-12-14 DIAGNOSIS — Z79899 Other long term (current) drug therapy: Secondary | ICD-10-CM | POA: Diagnosis not present

## 2014-12-14 DIAGNOSIS — G894 Chronic pain syndrome: Secondary | ICD-10-CM | POA: Diagnosis not present

## 2014-12-14 DIAGNOSIS — I10 Essential (primary) hypertension: Secondary | ICD-10-CM | POA: Diagnosis not present

## 2014-12-16 DIAGNOSIS — G5602 Carpal tunnel syndrome, left upper limb: Secondary | ICD-10-CM | POA: Diagnosis not present

## 2014-12-16 DIAGNOSIS — G253 Myoclonus: Secondary | ICD-10-CM | POA: Diagnosis not present

## 2014-12-16 DIAGNOSIS — G5601 Carpal tunnel syndrome, right upper limb: Secondary | ICD-10-CM | POA: Diagnosis not present

## 2014-12-19 DIAGNOSIS — M25512 Pain in left shoulder: Secondary | ICD-10-CM | POA: Diagnosis not present

## 2014-12-19 DIAGNOSIS — M4302 Spondylolysis, cervical region: Secondary | ICD-10-CM | POA: Diagnosis not present

## 2014-12-19 DIAGNOSIS — M542 Cervicalgia: Secondary | ICD-10-CM | POA: Diagnosis not present

## 2014-12-19 DIAGNOSIS — E1165 Type 2 diabetes mellitus with hyperglycemia: Secondary | ICD-10-CM | POA: Diagnosis not present

## 2014-12-19 DIAGNOSIS — M47812 Spondylosis without myelopathy or radiculopathy, cervical region: Secondary | ICD-10-CM | POA: Diagnosis not present

## 2014-12-21 DIAGNOSIS — M542 Cervicalgia: Secondary | ICD-10-CM | POA: Diagnosis not present

## 2014-12-23 DIAGNOSIS — M62838 Other muscle spasm: Secondary | ICD-10-CM | POA: Diagnosis not present

## 2014-12-23 DIAGNOSIS — M47812 Spondylosis without myelopathy or radiculopathy, cervical region: Secondary | ICD-10-CM | POA: Diagnosis not present

## 2014-12-23 DIAGNOSIS — M7540 Impingement syndrome of unspecified shoulder: Secondary | ICD-10-CM | POA: Diagnosis not present

## 2015-01-13 DIAGNOSIS — R262 Difficulty in walking, not elsewhere classified: Secondary | ICD-10-CM | POA: Diagnosis not present

## 2015-01-13 DIAGNOSIS — Z79899 Other long term (current) drug therapy: Secondary | ICD-10-CM | POA: Diagnosis not present

## 2015-01-13 DIAGNOSIS — M542 Cervicalgia: Secondary | ICD-10-CM | POA: Diagnosis not present

## 2015-01-13 DIAGNOSIS — M549 Dorsalgia, unspecified: Secondary | ICD-10-CM | POA: Diagnosis not present

## 2015-01-13 DIAGNOSIS — G894 Chronic pain syndrome: Secondary | ICD-10-CM | POA: Diagnosis not present

## 2015-01-13 DIAGNOSIS — Z6841 Body Mass Index (BMI) 40.0 and over, adult: Secondary | ICD-10-CM | POA: Diagnosis not present

## 2015-01-13 DIAGNOSIS — G253 Myoclonus: Secondary | ICD-10-CM | POA: Diagnosis not present

## 2015-01-13 DIAGNOSIS — E1165 Type 2 diabetes mellitus with hyperglycemia: Secondary | ICD-10-CM | POA: Diagnosis not present

## 2015-01-13 DIAGNOSIS — I10 Essential (primary) hypertension: Secondary | ICD-10-CM | POA: Diagnosis not present

## 2015-01-20 DIAGNOSIS — G253 Myoclonus: Secondary | ICD-10-CM | POA: Diagnosis not present

## 2015-01-20 DIAGNOSIS — G5603 Carpal tunnel syndrome, bilateral upper limbs: Secondary | ICD-10-CM | POA: Diagnosis not present

## 2015-02-08 DIAGNOSIS — G253 Myoclonus: Secondary | ICD-10-CM | POA: Diagnosis not present

## 2015-02-15 DIAGNOSIS — M549 Dorsalgia, unspecified: Secondary | ICD-10-CM | POA: Diagnosis not present

## 2015-02-15 DIAGNOSIS — Z79899 Other long term (current) drug therapy: Secondary | ICD-10-CM | POA: Diagnosis not present

## 2015-02-15 DIAGNOSIS — I10 Essential (primary) hypertension: Secondary | ICD-10-CM | POA: Diagnosis not present

## 2015-02-15 DIAGNOSIS — E1165 Type 2 diabetes mellitus with hyperglycemia: Secondary | ICD-10-CM | POA: Diagnosis not present

## 2015-02-20 DIAGNOSIS — R252 Cramp and spasm: Secondary | ICD-10-CM | POA: Diagnosis not present

## 2015-02-21 DIAGNOSIS — E119 Type 2 diabetes mellitus without complications: Secondary | ICD-10-CM | POA: Diagnosis not present

## 2015-03-17 DIAGNOSIS — E1165 Type 2 diabetes mellitus with hyperglycemia: Secondary | ICD-10-CM | POA: Diagnosis not present

## 2015-03-17 DIAGNOSIS — Z79899 Other long term (current) drug therapy: Secondary | ICD-10-CM | POA: Diagnosis not present

## 2015-03-17 DIAGNOSIS — I10 Essential (primary) hypertension: Secondary | ICD-10-CM | POA: Diagnosis not present

## 2015-03-17 DIAGNOSIS — R262 Difficulty in walking, not elsewhere classified: Secondary | ICD-10-CM | POA: Diagnosis not present

## 2015-03-17 DIAGNOSIS — M549 Dorsalgia, unspecified: Secondary | ICD-10-CM | POA: Diagnosis not present

## 2015-04-17 DIAGNOSIS — I10 Essential (primary) hypertension: Secondary | ICD-10-CM | POA: Diagnosis not present

## 2015-04-17 DIAGNOSIS — Z79899 Other long term (current) drug therapy: Secondary | ICD-10-CM | POA: Diagnosis not present

## 2015-04-17 DIAGNOSIS — M549 Dorsalgia, unspecified: Secondary | ICD-10-CM | POA: Diagnosis not present

## 2015-04-17 DIAGNOSIS — E1165 Type 2 diabetes mellitus with hyperglycemia: Secondary | ICD-10-CM | POA: Diagnosis not present

## 2015-04-17 DIAGNOSIS — G894 Chronic pain syndrome: Secondary | ICD-10-CM | POA: Diagnosis not present

## 2015-05-02 DIAGNOSIS — E1165 Type 2 diabetes mellitus with hyperglycemia: Secondary | ICD-10-CM | POA: Diagnosis not present

## 2015-05-02 DIAGNOSIS — I10 Essential (primary) hypertension: Secondary | ICD-10-CM | POA: Diagnosis not present

## 2015-05-16 DIAGNOSIS — Z5181 Encounter for therapeutic drug level monitoring: Secondary | ICD-10-CM | POA: Diagnosis not present

## 2015-05-16 DIAGNOSIS — G894 Chronic pain syndrome: Secondary | ICD-10-CM | POA: Diagnosis not present

## 2015-05-16 DIAGNOSIS — I10 Essential (primary) hypertension: Secondary | ICD-10-CM | POA: Diagnosis not present

## 2015-05-16 DIAGNOSIS — M549 Dorsalgia, unspecified: Secondary | ICD-10-CM | POA: Diagnosis not present

## 2015-05-16 DIAGNOSIS — E1165 Type 2 diabetes mellitus with hyperglycemia: Secondary | ICD-10-CM | POA: Diagnosis not present

## 2015-06-12 DIAGNOSIS — R1084 Generalized abdominal pain: Secondary | ICD-10-CM | POA: Diagnosis not present

## 2015-06-12 DIAGNOSIS — N200 Calculus of kidney: Secondary | ICD-10-CM | POA: Diagnosis not present

## 2015-06-12 DIAGNOSIS — R079 Chest pain, unspecified: Secondary | ICD-10-CM | POA: Diagnosis not present

## 2015-06-12 DIAGNOSIS — R109 Unspecified abdominal pain: Secondary | ICD-10-CM | POA: Diagnosis not present

## 2015-06-12 DIAGNOSIS — R000 Tachycardia, unspecified: Secondary | ICD-10-CM | POA: Diagnosis not present

## 2015-06-12 DIAGNOSIS — E1165 Type 2 diabetes mellitus with hyperglycemia: Secondary | ICD-10-CM | POA: Diagnosis not present

## 2015-06-12 DIAGNOSIS — R112 Nausea with vomiting, unspecified: Secondary | ICD-10-CM | POA: Diagnosis not present

## 2015-06-14 DIAGNOSIS — E1165 Type 2 diabetes mellitus with hyperglycemia: Secondary | ICD-10-CM | POA: Diagnosis not present

## 2015-06-14 DIAGNOSIS — G894 Chronic pain syndrome: Secondary | ICD-10-CM | POA: Diagnosis not present

## 2015-06-14 DIAGNOSIS — M549 Dorsalgia, unspecified: Secondary | ICD-10-CM | POA: Diagnosis not present

## 2015-06-14 DIAGNOSIS — I10 Essential (primary) hypertension: Secondary | ICD-10-CM | POA: Diagnosis not present

## 2015-06-14 DIAGNOSIS — Z5181 Encounter for therapeutic drug level monitoring: Secondary | ICD-10-CM | POA: Diagnosis not present

## 2015-07-14 DIAGNOSIS — M549 Dorsalgia, unspecified: Secondary | ICD-10-CM | POA: Diagnosis not present

## 2015-07-14 DIAGNOSIS — G894 Chronic pain syndrome: Secondary | ICD-10-CM | POA: Diagnosis not present

## 2015-07-14 DIAGNOSIS — I10 Essential (primary) hypertension: Secondary | ICD-10-CM | POA: Diagnosis not present

## 2015-07-14 DIAGNOSIS — E1165 Type 2 diabetes mellitus with hyperglycemia: Secondary | ICD-10-CM | POA: Diagnosis not present

## 2015-08-11 DIAGNOSIS — M549 Dorsalgia, unspecified: Secondary | ICD-10-CM | POA: Diagnosis not present

## 2015-08-11 DIAGNOSIS — G894 Chronic pain syndrome: Secondary | ICD-10-CM | POA: Diagnosis not present

## 2015-08-11 DIAGNOSIS — E1165 Type 2 diabetes mellitus with hyperglycemia: Secondary | ICD-10-CM | POA: Diagnosis not present

## 2015-08-11 DIAGNOSIS — Z5181 Encounter for therapeutic drug level monitoring: Secondary | ICD-10-CM | POA: Diagnosis not present

## 2015-08-11 DIAGNOSIS — I10 Essential (primary) hypertension: Secondary | ICD-10-CM | POA: Diagnosis not present

## 2015-09-11 DIAGNOSIS — G894 Chronic pain syndrome: Secondary | ICD-10-CM | POA: Diagnosis not present

## 2015-09-11 DIAGNOSIS — M549 Dorsalgia, unspecified: Secondary | ICD-10-CM | POA: Diagnosis not present

## 2015-09-11 DIAGNOSIS — I10 Essential (primary) hypertension: Secondary | ICD-10-CM | POA: Diagnosis not present

## 2015-09-11 DIAGNOSIS — Z5181 Encounter for therapeutic drug level monitoring: Secondary | ICD-10-CM | POA: Diagnosis not present

## 2015-09-11 DIAGNOSIS — E1165 Type 2 diabetes mellitus with hyperglycemia: Secondary | ICD-10-CM | POA: Diagnosis not present

## 2015-10-11 DIAGNOSIS — Z5181 Encounter for therapeutic drug level monitoring: Secondary | ICD-10-CM | POA: Diagnosis not present

## 2015-10-11 DIAGNOSIS — I10 Essential (primary) hypertension: Secondary | ICD-10-CM | POA: Diagnosis not present

## 2015-10-11 DIAGNOSIS — M25511 Pain in right shoulder: Secondary | ICD-10-CM | POA: Diagnosis not present

## 2015-10-11 DIAGNOSIS — G894 Chronic pain syndrome: Secondary | ICD-10-CM | POA: Diagnosis not present

## 2015-10-11 DIAGNOSIS — Z79891 Long term (current) use of opiate analgesic: Secondary | ICD-10-CM | POA: Diagnosis not present

## 2015-10-11 DIAGNOSIS — M549 Dorsalgia, unspecified: Secondary | ICD-10-CM | POA: Diagnosis not present

## 2015-10-11 DIAGNOSIS — E1165 Type 2 diabetes mellitus with hyperglycemia: Secondary | ICD-10-CM | POA: Diagnosis not present

## 2015-11-08 DIAGNOSIS — I10 Essential (primary) hypertension: Secondary | ICD-10-CM | POA: Diagnosis not present

## 2015-11-08 DIAGNOSIS — E1165 Type 2 diabetes mellitus with hyperglycemia: Secondary | ICD-10-CM | POA: Diagnosis not present

## 2015-11-08 DIAGNOSIS — M549 Dorsalgia, unspecified: Secondary | ICD-10-CM | POA: Diagnosis not present

## 2015-11-08 DIAGNOSIS — G894 Chronic pain syndrome: Secondary | ICD-10-CM | POA: Diagnosis not present

## 2015-11-08 DIAGNOSIS — Z5181 Encounter for therapeutic drug level monitoring: Secondary | ICD-10-CM | POA: Diagnosis not present

## 2015-11-22 DIAGNOSIS — E1165 Type 2 diabetes mellitus with hyperglycemia: Secondary | ICD-10-CM | POA: Diagnosis not present

## 2015-11-22 DIAGNOSIS — Z794 Long term (current) use of insulin: Secondary | ICD-10-CM | POA: Diagnosis not present

## 2015-11-22 DIAGNOSIS — I10 Essential (primary) hypertension: Secondary | ICD-10-CM | POA: Diagnosis not present

## 2015-12-05 DIAGNOSIS — E119 Type 2 diabetes mellitus without complications: Secondary | ICD-10-CM | POA: Diagnosis not present

## 2015-12-05 DIAGNOSIS — Z0000 Encounter for general adult medical examination without abnormal findings: Secondary | ICD-10-CM | POA: Diagnosis not present

## 2015-12-05 DIAGNOSIS — M1990 Unspecified osteoarthritis, unspecified site: Secondary | ICD-10-CM | POA: Diagnosis not present

## 2015-12-05 DIAGNOSIS — I10 Essential (primary) hypertension: Secondary | ICD-10-CM | POA: Diagnosis not present

## 2015-12-05 DIAGNOSIS — Z1389 Encounter for screening for other disorder: Secondary | ICD-10-CM | POA: Diagnosis not present

## 2016-01-03 DIAGNOSIS — G8929 Other chronic pain: Secondary | ICD-10-CM | POA: Diagnosis not present

## 2016-01-03 DIAGNOSIS — M5441 Lumbago with sciatica, right side: Secondary | ICD-10-CM | POA: Diagnosis not present

## 2016-01-03 DIAGNOSIS — Z5181 Encounter for therapeutic drug level monitoring: Secondary | ICD-10-CM | POA: Diagnosis not present

## 2016-01-03 DIAGNOSIS — E1165 Type 2 diabetes mellitus with hyperglycemia: Secondary | ICD-10-CM | POA: Diagnosis not present

## 2016-01-03 DIAGNOSIS — I10 Essential (primary) hypertension: Secondary | ICD-10-CM | POA: Diagnosis not present

## 2016-01-19 DIAGNOSIS — M5416 Radiculopathy, lumbar region: Secondary | ICD-10-CM | POA: Diagnosis not present

## 2016-01-19 DIAGNOSIS — M545 Low back pain: Secondary | ICD-10-CM | POA: Diagnosis not present

## 2016-02-02 DIAGNOSIS — M5441 Lumbago with sciatica, right side: Secondary | ICD-10-CM | POA: Diagnosis not present

## 2016-02-02 DIAGNOSIS — Z794 Long term (current) use of insulin: Secondary | ICD-10-CM | POA: Diagnosis not present

## 2016-02-02 DIAGNOSIS — Z5181 Encounter for therapeutic drug level monitoring: Secondary | ICD-10-CM | POA: Diagnosis not present

## 2016-02-02 DIAGNOSIS — E119 Type 2 diabetes mellitus without complications: Secondary | ICD-10-CM | POA: Diagnosis not present

## 2016-02-02 DIAGNOSIS — G8929 Other chronic pain: Secondary | ICD-10-CM | POA: Diagnosis not present

## 2016-03-01 DIAGNOSIS — I10 Essential (primary) hypertension: Secondary | ICD-10-CM | POA: Diagnosis not present

## 2016-03-01 DIAGNOSIS — Z5181 Encounter for therapeutic drug level monitoring: Secondary | ICD-10-CM | POA: Diagnosis not present

## 2016-03-01 DIAGNOSIS — E1165 Type 2 diabetes mellitus with hyperglycemia: Secondary | ICD-10-CM | POA: Diagnosis not present

## 2016-03-01 DIAGNOSIS — M5441 Lumbago with sciatica, right side: Secondary | ICD-10-CM | POA: Diagnosis not present

## 2016-03-19 DIAGNOSIS — E119 Type 2 diabetes mellitus without complications: Secondary | ICD-10-CM | POA: Diagnosis not present

## 2016-04-02 DIAGNOSIS — G8929 Other chronic pain: Secondary | ICD-10-CM | POA: Diagnosis not present

## 2016-04-02 DIAGNOSIS — E1165 Type 2 diabetes mellitus with hyperglycemia: Secondary | ICD-10-CM | POA: Diagnosis not present

## 2016-04-02 DIAGNOSIS — Z5181 Encounter for therapeutic drug level monitoring: Secondary | ICD-10-CM | POA: Diagnosis not present

## 2016-04-02 DIAGNOSIS — M5441 Lumbago with sciatica, right side: Secondary | ICD-10-CM | POA: Diagnosis not present

## 2016-04-02 DIAGNOSIS — E785 Hyperlipidemia, unspecified: Secondary | ICD-10-CM | POA: Diagnosis not present

## 2016-04-19 DIAGNOSIS — M5441 Lumbago with sciatica, right side: Secondary | ICD-10-CM | POA: Diagnosis not present

## 2016-04-19 DIAGNOSIS — M545 Low back pain: Secondary | ICD-10-CM | POA: Diagnosis not present

## 2016-04-24 DIAGNOSIS — G8929 Other chronic pain: Secondary | ICD-10-CM | POA: Diagnosis not present

## 2016-04-24 DIAGNOSIS — M5441 Lumbago with sciatica, right side: Secondary | ICD-10-CM | POA: Diagnosis not present

## 2016-05-03 DIAGNOSIS — E1165 Type 2 diabetes mellitus with hyperglycemia: Secondary | ICD-10-CM | POA: Diagnosis not present

## 2016-05-03 DIAGNOSIS — I10 Essential (primary) hypertension: Secondary | ICD-10-CM | POA: Diagnosis not present

## 2016-05-03 DIAGNOSIS — M5441 Lumbago with sciatica, right side: Secondary | ICD-10-CM | POA: Diagnosis not present

## 2016-05-03 DIAGNOSIS — Z5181 Encounter for therapeutic drug level monitoring: Secondary | ICD-10-CM | POA: Diagnosis not present

## 2016-05-03 DIAGNOSIS — G8929 Other chronic pain: Secondary | ICD-10-CM | POA: Diagnosis not present

## 2016-05-31 DIAGNOSIS — M5441 Lumbago with sciatica, right side: Secondary | ICD-10-CM | POA: Diagnosis not present

## 2016-05-31 DIAGNOSIS — I10 Essential (primary) hypertension: Secondary | ICD-10-CM | POA: Diagnosis not present

## 2016-05-31 DIAGNOSIS — E1165 Type 2 diabetes mellitus with hyperglycemia: Secondary | ICD-10-CM | POA: Diagnosis not present

## 2016-05-31 DIAGNOSIS — E785 Hyperlipidemia, unspecified: Secondary | ICD-10-CM | POA: Diagnosis not present

## 2016-05-31 DIAGNOSIS — Z5181 Encounter for therapeutic drug level monitoring: Secondary | ICD-10-CM | POA: Diagnosis not present

## 2016-05-31 DIAGNOSIS — G8929 Other chronic pain: Secondary | ICD-10-CM | POA: Diagnosis not present

## 2016-07-01 DIAGNOSIS — Z5181 Encounter for therapeutic drug level monitoring: Secondary | ICD-10-CM | POA: Diagnosis not present

## 2016-07-01 DIAGNOSIS — Z79899 Other long term (current) drug therapy: Secondary | ICD-10-CM | POA: Diagnosis not present

## 2016-07-01 DIAGNOSIS — M5430 Sciatica, unspecified side: Secondary | ICD-10-CM | POA: Diagnosis not present

## 2016-07-01 DIAGNOSIS — G894 Chronic pain syndrome: Secondary | ICD-10-CM | POA: Diagnosis not present

## 2016-07-01 DIAGNOSIS — E1165 Type 2 diabetes mellitus with hyperglycemia: Secondary | ICD-10-CM | POA: Diagnosis not present

## 2016-07-31 DIAGNOSIS — G894 Chronic pain syndrome: Secondary | ICD-10-CM | POA: Diagnosis not present

## 2016-07-31 DIAGNOSIS — E1165 Type 2 diabetes mellitus with hyperglycemia: Secondary | ICD-10-CM | POA: Diagnosis not present

## 2016-07-31 DIAGNOSIS — Z5181 Encounter for therapeutic drug level monitoring: Secondary | ICD-10-CM | POA: Diagnosis not present

## 2016-07-31 DIAGNOSIS — M5430 Sciatica, unspecified side: Secondary | ICD-10-CM | POA: Diagnosis not present

## 2016-07-31 DIAGNOSIS — I10 Essential (primary) hypertension: Secondary | ICD-10-CM | POA: Diagnosis not present

## 2016-08-29 DIAGNOSIS — Z5181 Encounter for therapeutic drug level monitoring: Secondary | ICD-10-CM | POA: Diagnosis not present

## 2016-08-29 DIAGNOSIS — E1165 Type 2 diabetes mellitus with hyperglycemia: Secondary | ICD-10-CM | POA: Diagnosis not present

## 2016-08-29 DIAGNOSIS — Z79899 Other long term (current) drug therapy: Secondary | ICD-10-CM | POA: Diagnosis not present

## 2016-08-29 DIAGNOSIS — M5430 Sciatica, unspecified side: Secondary | ICD-10-CM | POA: Diagnosis not present

## 2016-08-29 DIAGNOSIS — G894 Chronic pain syndrome: Secondary | ICD-10-CM | POA: Diagnosis not present

## 2016-09-24 DIAGNOSIS — Z5181 Encounter for therapeutic drug level monitoring: Secondary | ICD-10-CM | POA: Diagnosis not present

## 2016-09-24 DIAGNOSIS — G894 Chronic pain syndrome: Secondary | ICD-10-CM | POA: Diagnosis not present

## 2016-09-24 DIAGNOSIS — B370 Candidal stomatitis: Secondary | ICD-10-CM | POA: Diagnosis not present

## 2016-09-24 DIAGNOSIS — E1165 Type 2 diabetes mellitus with hyperglycemia: Secondary | ICD-10-CM | POA: Diagnosis not present

## 2016-09-24 DIAGNOSIS — E781 Pure hyperglyceridemia: Secondary | ICD-10-CM | POA: Diagnosis not present

## 2016-10-09 DIAGNOSIS — E781 Pure hyperglyceridemia: Secondary | ICD-10-CM | POA: Diagnosis not present

## 2016-10-09 DIAGNOSIS — E1165 Type 2 diabetes mellitus with hyperglycemia: Secondary | ICD-10-CM | POA: Diagnosis not present

## 2016-10-09 DIAGNOSIS — B370 Candidal stomatitis: Secondary | ICD-10-CM | POA: Diagnosis not present

## 2016-10-25 DIAGNOSIS — Z79899 Other long term (current) drug therapy: Secondary | ICD-10-CM | POA: Diagnosis not present

## 2016-10-25 DIAGNOSIS — Z5181 Encounter for therapeutic drug level monitoring: Secondary | ICD-10-CM | POA: Diagnosis not present

## 2016-10-25 DIAGNOSIS — E1165 Type 2 diabetes mellitus with hyperglycemia: Secondary | ICD-10-CM | POA: Diagnosis not present

## 2016-10-25 DIAGNOSIS — G894 Chronic pain syndrome: Secondary | ICD-10-CM | POA: Diagnosis not present

## 2016-10-25 DIAGNOSIS — E781 Pure hyperglyceridemia: Secondary | ICD-10-CM | POA: Diagnosis not present

## 2016-11-26 DIAGNOSIS — Z5181 Encounter for therapeutic drug level monitoring: Secondary | ICD-10-CM | POA: Diagnosis not present

## 2016-11-26 DIAGNOSIS — E785 Hyperlipidemia, unspecified: Secondary | ICD-10-CM | POA: Diagnosis not present

## 2016-11-26 DIAGNOSIS — Z79899 Other long term (current) drug therapy: Secondary | ICD-10-CM | POA: Diagnosis not present

## 2016-11-26 DIAGNOSIS — G894 Chronic pain syndrome: Secondary | ICD-10-CM | POA: Diagnosis not present

## 2016-12-27 DIAGNOSIS — Z5181 Encounter for therapeutic drug level monitoring: Secondary | ICD-10-CM | POA: Diagnosis not present

## 2016-12-27 DIAGNOSIS — E1140 Type 2 diabetes mellitus with diabetic neuropathy, unspecified: Secondary | ICD-10-CM | POA: Diagnosis not present

## 2016-12-27 DIAGNOSIS — G894 Chronic pain syndrome: Secondary | ICD-10-CM | POA: Diagnosis not present

## 2016-12-27 DIAGNOSIS — E785 Hyperlipidemia, unspecified: Secondary | ICD-10-CM | POA: Diagnosis not present

## 2016-12-27 DIAGNOSIS — E1165 Type 2 diabetes mellitus with hyperglycemia: Secondary | ICD-10-CM | POA: Diagnosis not present

## 2016-12-28 DIAGNOSIS — K529 Noninfective gastroenteritis and colitis, unspecified: Secondary | ICD-10-CM | POA: Diagnosis not present

## 2016-12-28 DIAGNOSIS — E119 Type 2 diabetes mellitus without complications: Secondary | ICD-10-CM | POA: Diagnosis not present

## 2017-01-23 DIAGNOSIS — E1122 Type 2 diabetes mellitus with diabetic chronic kidney disease: Secondary | ICD-10-CM | POA: Diagnosis not present

## 2017-01-23 DIAGNOSIS — I517 Cardiomegaly: Secondary | ICD-10-CM | POA: Diagnosis not present

## 2017-01-23 DIAGNOSIS — N183 Chronic kidney disease, stage 3 (moderate): Secondary | ICD-10-CM | POA: Diagnosis not present

## 2017-01-23 DIAGNOSIS — I129 Hypertensive chronic kidney disease with stage 1 through stage 4 chronic kidney disease, or unspecified chronic kidney disease: Secondary | ICD-10-CM | POA: Diagnosis not present

## 2017-01-23 DIAGNOSIS — E1165 Type 2 diabetes mellitus with hyperglycemia: Secondary | ICD-10-CM | POA: Diagnosis not present

## 2017-01-28 DIAGNOSIS — Z79899 Other long term (current) drug therapy: Secondary | ICD-10-CM | POA: Diagnosis not present

## 2017-01-28 DIAGNOSIS — E1165 Type 2 diabetes mellitus with hyperglycemia: Secondary | ICD-10-CM | POA: Diagnosis not present

## 2017-01-28 DIAGNOSIS — G8929 Other chronic pain: Secondary | ICD-10-CM | POA: Diagnosis not present

## 2017-01-28 DIAGNOSIS — Z5181 Encounter for therapeutic drug level monitoring: Secondary | ICD-10-CM | POA: Diagnosis not present

## 2017-02-27 DIAGNOSIS — G894 Chronic pain syndrome: Secondary | ICD-10-CM | POA: Diagnosis not present

## 2017-02-27 DIAGNOSIS — Z5181 Encounter for therapeutic drug level monitoring: Secondary | ICD-10-CM | POA: Diagnosis not present

## 2017-02-27 DIAGNOSIS — Z79899 Other long term (current) drug therapy: Secondary | ICD-10-CM | POA: Diagnosis not present

## 2017-03-21 DIAGNOSIS — E11618 Type 2 diabetes mellitus with other diabetic arthropathy: Secondary | ICD-10-CM | POA: Diagnosis not present

## 2017-03-21 DIAGNOSIS — E785 Hyperlipidemia, unspecified: Secondary | ICD-10-CM | POA: Diagnosis not present

## 2017-03-21 DIAGNOSIS — E1165 Type 2 diabetes mellitus with hyperglycemia: Secondary | ICD-10-CM | POA: Diagnosis not present

## 2017-03-21 DIAGNOSIS — R0789 Other chest pain: Secondary | ICD-10-CM | POA: Diagnosis not present

## 2017-03-21 DIAGNOSIS — E1140 Type 2 diabetes mellitus with diabetic neuropathy, unspecified: Secondary | ICD-10-CM | POA: Diagnosis not present

## 2017-03-21 DIAGNOSIS — E781 Pure hyperglyceridemia: Secondary | ICD-10-CM | POA: Diagnosis not present

## 2017-03-26 DIAGNOSIS — E1165 Type 2 diabetes mellitus with hyperglycemia: Secondary | ICD-10-CM | POA: Diagnosis not present

## 2017-03-26 DIAGNOSIS — M5416 Radiculopathy, lumbar region: Secondary | ICD-10-CM | POA: Diagnosis not present

## 2017-03-27 DIAGNOSIS — I10 Essential (primary) hypertension: Secondary | ICD-10-CM | POA: Diagnosis not present

## 2017-03-27 DIAGNOSIS — E11618 Type 2 diabetes mellitus with other diabetic arthropathy: Secondary | ICD-10-CM | POA: Diagnosis not present

## 2017-03-27 DIAGNOSIS — E785 Hyperlipidemia, unspecified: Secondary | ICD-10-CM | POA: Diagnosis not present

## 2017-03-27 DIAGNOSIS — E1165 Type 2 diabetes mellitus with hyperglycemia: Secondary | ICD-10-CM | POA: Diagnosis not present

## 2017-03-27 DIAGNOSIS — E781 Pure hyperglyceridemia: Secondary | ICD-10-CM | POA: Diagnosis not present

## 2017-03-31 DIAGNOSIS — Z79899 Other long term (current) drug therapy: Secondary | ICD-10-CM | POA: Diagnosis not present

## 2017-03-31 DIAGNOSIS — M549 Dorsalgia, unspecified: Secondary | ICD-10-CM | POA: Diagnosis not present

## 2017-03-31 DIAGNOSIS — Z5181 Encounter for therapeutic drug level monitoring: Secondary | ICD-10-CM | POA: Diagnosis not present

## 2017-03-31 DIAGNOSIS — G894 Chronic pain syndrome: Secondary | ICD-10-CM | POA: Diagnosis not present

## 2017-04-29 DIAGNOSIS — M549 Dorsalgia, unspecified: Secondary | ICD-10-CM | POA: Diagnosis not present

## 2017-04-29 DIAGNOSIS — E11618 Type 2 diabetes mellitus with other diabetic arthropathy: Secondary | ICD-10-CM | POA: Diagnosis not present

## 2017-04-29 DIAGNOSIS — G894 Chronic pain syndrome: Secondary | ICD-10-CM | POA: Diagnosis not present

## 2017-04-29 DIAGNOSIS — Z79899 Other long term (current) drug therapy: Secondary | ICD-10-CM | POA: Diagnosis not present

## 2017-04-29 DIAGNOSIS — Z5181 Encounter for therapeutic drug level monitoring: Secondary | ICD-10-CM | POA: Diagnosis not present

## 2017-05-27 DIAGNOSIS — G894 Chronic pain syndrome: Secondary | ICD-10-CM | POA: Diagnosis not present

## 2017-05-27 DIAGNOSIS — M549 Dorsalgia, unspecified: Secondary | ICD-10-CM | POA: Diagnosis not present

## 2017-05-27 DIAGNOSIS — Z79899 Other long term (current) drug therapy: Secondary | ICD-10-CM | POA: Diagnosis not present

## 2017-05-27 DIAGNOSIS — Z5181 Encounter for therapeutic drug level monitoring: Secondary | ICD-10-CM | POA: Diagnosis not present

## 2017-06-04 DIAGNOSIS — E119 Type 2 diabetes mellitus without complications: Secondary | ICD-10-CM | POA: Diagnosis not present

## 2017-06-04 DIAGNOSIS — H524 Presbyopia: Secondary | ICD-10-CM | POA: Diagnosis not present

## 2017-06-04 DIAGNOSIS — H5213 Myopia, bilateral: Secondary | ICD-10-CM | POA: Diagnosis not present

## 2017-06-24 DIAGNOSIS — E785 Hyperlipidemia, unspecified: Secondary | ICD-10-CM | POA: Diagnosis not present

## 2017-06-24 DIAGNOSIS — M549 Dorsalgia, unspecified: Secondary | ICD-10-CM | POA: Diagnosis not present

## 2017-06-24 DIAGNOSIS — E1143 Type 2 diabetes mellitus with diabetic autonomic (poly)neuropathy: Secondary | ICD-10-CM | POA: Diagnosis not present

## 2017-06-24 DIAGNOSIS — E1165 Type 2 diabetes mellitus with hyperglycemia: Secondary | ICD-10-CM | POA: Diagnosis not present

## 2017-06-24 DIAGNOSIS — G8929 Other chronic pain: Secondary | ICD-10-CM | POA: Diagnosis not present

## 2017-06-24 DIAGNOSIS — E781 Pure hyperglyceridemia: Secondary | ICD-10-CM | POA: Diagnosis not present

## 2017-06-24 DIAGNOSIS — Z5181 Encounter for therapeutic drug level monitoring: Secondary | ICD-10-CM | POA: Diagnosis not present

## 2017-07-01 DIAGNOSIS — G4733 Obstructive sleep apnea (adult) (pediatric): Secondary | ICD-10-CM | POA: Diagnosis not present

## 2017-07-22 DIAGNOSIS — Z5181 Encounter for therapeutic drug level monitoring: Secondary | ICD-10-CM | POA: Diagnosis not present

## 2017-07-22 DIAGNOSIS — G894 Chronic pain syndrome: Secondary | ICD-10-CM | POA: Diagnosis not present

## 2017-07-22 DIAGNOSIS — E785 Hyperlipidemia, unspecified: Secondary | ICD-10-CM | POA: Diagnosis not present

## 2017-07-22 DIAGNOSIS — E1144 Type 2 diabetes mellitus with diabetic amyotrophy: Secondary | ICD-10-CM | POA: Diagnosis not present

## 2017-07-22 DIAGNOSIS — E781 Pure hyperglyceridemia: Secondary | ICD-10-CM | POA: Diagnosis not present

## 2017-07-22 DIAGNOSIS — M549 Dorsalgia, unspecified: Secondary | ICD-10-CM | POA: Diagnosis not present

## 2017-08-14 DIAGNOSIS — E1140 Type 2 diabetes mellitus with diabetic neuropathy, unspecified: Secondary | ICD-10-CM | POA: Diagnosis not present

## 2017-08-14 DIAGNOSIS — E1144 Type 2 diabetes mellitus with diabetic amyotrophy: Secondary | ICD-10-CM | POA: Diagnosis not present

## 2017-08-14 DIAGNOSIS — G894 Chronic pain syndrome: Secondary | ICD-10-CM | POA: Diagnosis not present

## 2017-08-14 DIAGNOSIS — Z5181 Encounter for therapeutic drug level monitoring: Secondary | ICD-10-CM | POA: Diagnosis not present

## 2017-08-14 DIAGNOSIS — E785 Hyperlipidemia, unspecified: Secondary | ICD-10-CM | POA: Diagnosis not present

## 2017-08-14 DIAGNOSIS — E1165 Type 2 diabetes mellitus with hyperglycemia: Secondary | ICD-10-CM | POA: Diagnosis not present

## 2017-09-05 DIAGNOSIS — E119 Type 2 diabetes mellitus without complications: Secondary | ICD-10-CM | POA: Diagnosis not present

## 2017-09-05 DIAGNOSIS — Z0000 Encounter for general adult medical examination without abnormal findings: Secondary | ICD-10-CM | POA: Diagnosis not present

## 2017-09-05 DIAGNOSIS — E785 Hyperlipidemia, unspecified: Secondary | ICD-10-CM | POA: Diagnosis not present

## 2017-09-05 DIAGNOSIS — I10 Essential (primary) hypertension: Secondary | ICD-10-CM | POA: Diagnosis not present

## 2017-09-05 DIAGNOSIS — E1140 Type 2 diabetes mellitus with diabetic neuropathy, unspecified: Secondary | ICD-10-CM | POA: Diagnosis not present

## 2017-09-05 DIAGNOSIS — Z1389 Encounter for screening for other disorder: Secondary | ICD-10-CM | POA: Diagnosis not present

## 2017-09-05 DIAGNOSIS — Z124 Encounter for screening for malignant neoplasm of cervix: Secondary | ICD-10-CM | POA: Diagnosis not present

## 2017-09-08 DIAGNOSIS — E1122 Type 2 diabetes mellitus with diabetic chronic kidney disease: Secondary | ICD-10-CM | POA: Diagnosis not present

## 2017-09-08 DIAGNOSIS — Z7984 Long term (current) use of oral hypoglycemic drugs: Secondary | ICD-10-CM | POA: Diagnosis not present

## 2017-09-08 DIAGNOSIS — R1084 Generalized abdominal pain: Secondary | ICD-10-CM | POA: Diagnosis not present

## 2017-09-08 DIAGNOSIS — K219 Gastro-esophageal reflux disease without esophagitis: Secondary | ICD-10-CM | POA: Diagnosis not present

## 2017-09-08 DIAGNOSIS — Z794 Long term (current) use of insulin: Secondary | ICD-10-CM | POA: Diagnosis not present

## 2017-09-08 DIAGNOSIS — J42 Unspecified chronic bronchitis: Secondary | ICD-10-CM | POA: Diagnosis not present

## 2017-09-08 DIAGNOSIS — Z79899 Other long term (current) drug therapy: Secondary | ICD-10-CM | POA: Diagnosis not present

## 2017-09-08 DIAGNOSIS — N183 Chronic kidney disease, stage 3 (moderate): Secondary | ICD-10-CM | POA: Diagnosis not present

## 2017-09-08 DIAGNOSIS — I129 Hypertensive chronic kidney disease with stage 1 through stage 4 chronic kidney disease, or unspecified chronic kidney disease: Secondary | ICD-10-CM | POA: Diagnosis not present

## 2017-09-08 DIAGNOSIS — E1165 Type 2 diabetes mellitus with hyperglycemia: Secondary | ICD-10-CM | POA: Diagnosis not present

## 2017-09-08 DIAGNOSIS — R079 Chest pain, unspecified: Secondary | ICD-10-CM | POA: Diagnosis not present

## 2017-09-11 DIAGNOSIS — G894 Chronic pain syndrome: Secondary | ICD-10-CM | POA: Diagnosis not present

## 2017-09-11 DIAGNOSIS — E1144 Type 2 diabetes mellitus with diabetic amyotrophy: Secondary | ICD-10-CM | POA: Diagnosis not present

## 2017-09-11 DIAGNOSIS — Z5181 Encounter for therapeutic drug level monitoring: Secondary | ICD-10-CM | POA: Diagnosis not present

## 2017-09-11 DIAGNOSIS — E1140 Type 2 diabetes mellitus with diabetic neuropathy, unspecified: Secondary | ICD-10-CM | POA: Diagnosis not present

## 2017-09-11 DIAGNOSIS — E785 Hyperlipidemia, unspecified: Secondary | ICD-10-CM | POA: Diagnosis not present

## 2017-09-12 DIAGNOSIS — Z1231 Encounter for screening mammogram for malignant neoplasm of breast: Secondary | ICD-10-CM | POA: Diagnosis not present

## 2017-10-09 DIAGNOSIS — Z79899 Other long term (current) drug therapy: Secondary | ICD-10-CM | POA: Diagnosis not present

## 2017-10-09 DIAGNOSIS — G894 Chronic pain syndrome: Secondary | ICD-10-CM | POA: Diagnosis not present

## 2017-10-09 DIAGNOSIS — Z5181 Encounter for therapeutic drug level monitoring: Secondary | ICD-10-CM | POA: Diagnosis not present

## 2017-10-27 DIAGNOSIS — E0840 Diabetes mellitus due to underlying condition with diabetic neuropathy, unspecified: Secondary | ICD-10-CM | POA: Diagnosis not present

## 2017-10-27 DIAGNOSIS — E1165 Type 2 diabetes mellitus with hyperglycemia: Secondary | ICD-10-CM | POA: Diagnosis not present

## 2017-10-27 DIAGNOSIS — E7800 Pure hypercholesterolemia, unspecified: Secondary | ICD-10-CM | POA: Diagnosis not present

## 2017-10-27 DIAGNOSIS — E782 Mixed hyperlipidemia: Secondary | ICD-10-CM | POA: Diagnosis not present

## 2017-10-27 DIAGNOSIS — I10 Essential (primary) hypertension: Secondary | ICD-10-CM | POA: Diagnosis not present

## 2017-10-31 DIAGNOSIS — M25561 Pain in right knee: Secondary | ICD-10-CM | POA: Diagnosis not present

## 2017-10-31 DIAGNOSIS — M25562 Pain in left knee: Secondary | ICD-10-CM | POA: Diagnosis not present

## 2017-10-31 DIAGNOSIS — M545 Low back pain: Secondary | ICD-10-CM | POA: Diagnosis not present

## 2017-10-31 DIAGNOSIS — G894 Chronic pain syndrome: Secondary | ICD-10-CM | POA: Diagnosis not present

## 2017-11-06 DIAGNOSIS — Z5181 Encounter for therapeutic drug level monitoring: Secondary | ICD-10-CM | POA: Diagnosis not present

## 2017-11-06 DIAGNOSIS — G894 Chronic pain syndrome: Secondary | ICD-10-CM | POA: Diagnosis not present

## 2017-11-06 DIAGNOSIS — M25561 Pain in right knee: Secondary | ICD-10-CM | POA: Diagnosis not present

## 2017-11-06 DIAGNOSIS — M545 Low back pain: Secondary | ICD-10-CM | POA: Diagnosis not present

## 2017-11-06 DIAGNOSIS — M25562 Pain in left knee: Secondary | ICD-10-CM | POA: Diagnosis not present

## 2017-11-06 DIAGNOSIS — Z79899 Other long term (current) drug therapy: Secondary | ICD-10-CM | POA: Diagnosis not present

## 2017-12-07 DIAGNOSIS — Z9114 Patient's other noncompliance with medication regimen: Secondary | ICD-10-CM | POA: Diagnosis not present

## 2017-12-07 DIAGNOSIS — N200 Calculus of kidney: Secondary | ICD-10-CM | POA: Diagnosis not present

## 2017-12-07 DIAGNOSIS — Z7984 Long term (current) use of oral hypoglycemic drugs: Secondary | ICD-10-CM | POA: Diagnosis not present

## 2017-12-07 DIAGNOSIS — Z79899 Other long term (current) drug therapy: Secondary | ICD-10-CM | POA: Diagnosis not present

## 2017-12-07 DIAGNOSIS — M545 Low back pain: Secondary | ICD-10-CM | POA: Diagnosis not present

## 2017-12-07 DIAGNOSIS — E1122 Type 2 diabetes mellitus with diabetic chronic kidney disease: Secondary | ICD-10-CM | POA: Diagnosis not present

## 2017-12-07 DIAGNOSIS — Z794 Long term (current) use of insulin: Secondary | ICD-10-CM | POA: Diagnosis not present

## 2017-12-07 DIAGNOSIS — G8929 Other chronic pain: Secondary | ICD-10-CM | POA: Diagnosis not present

## 2017-12-07 DIAGNOSIS — Z79891 Long term (current) use of opiate analgesic: Secondary | ICD-10-CM | POA: Diagnosis not present

## 2017-12-07 DIAGNOSIS — I129 Hypertensive chronic kidney disease with stage 1 through stage 4 chronic kidney disease, or unspecified chronic kidney disease: Secondary | ICD-10-CM | POA: Diagnosis not present

## 2017-12-07 DIAGNOSIS — R1032 Left lower quadrant pain: Secondary | ICD-10-CM | POA: Diagnosis not present

## 2017-12-07 DIAGNOSIS — E1165 Type 2 diabetes mellitus with hyperglycemia: Secondary | ICD-10-CM | POA: Diagnosis not present

## 2017-12-07 DIAGNOSIS — N183 Chronic kidney disease, stage 3 (moderate): Secondary | ICD-10-CM | POA: Diagnosis not present

## 2017-12-07 DIAGNOSIS — R1030 Lower abdominal pain, unspecified: Secondary | ICD-10-CM | POA: Diagnosis not present

## 2017-12-07 DIAGNOSIS — J449 Chronic obstructive pulmonary disease, unspecified: Secondary | ICD-10-CM | POA: Diagnosis not present

## 2017-12-07 DIAGNOSIS — E119 Type 2 diabetes mellitus without complications: Secondary | ICD-10-CM | POA: Diagnosis not present

## 2017-12-08 DIAGNOSIS — G894 Chronic pain syndrome: Secondary | ICD-10-CM | POA: Diagnosis not present

## 2017-12-08 DIAGNOSIS — K5900 Constipation, unspecified: Secondary | ICD-10-CM | POA: Diagnosis not present

## 2017-12-08 DIAGNOSIS — Z79899 Other long term (current) drug therapy: Secondary | ICD-10-CM | POA: Diagnosis not present

## 2017-12-08 DIAGNOSIS — Z5181 Encounter for therapeutic drug level monitoring: Secondary | ICD-10-CM | POA: Diagnosis not present

## 2017-12-08 DIAGNOSIS — N200 Calculus of kidney: Secondary | ICD-10-CM | POA: Diagnosis not present

## 2018-01-01 DIAGNOSIS — I10 Essential (primary) hypertension: Secondary | ICD-10-CM | POA: Diagnosis not present

## 2018-01-01 DIAGNOSIS — S199XXA Unspecified injury of neck, initial encounter: Secondary | ICD-10-CM | POA: Diagnosis not present

## 2018-01-01 DIAGNOSIS — S8001XA Contusion of right knee, initial encounter: Secondary | ICD-10-CM | POA: Diagnosis not present

## 2018-01-01 DIAGNOSIS — W19XXXA Unspecified fall, initial encounter: Secondary | ICD-10-CM | POA: Diagnosis not present

## 2018-01-01 DIAGNOSIS — R079 Chest pain, unspecified: Secondary | ICD-10-CM | POA: Diagnosis not present

## 2018-01-01 DIAGNOSIS — Z79899 Other long term (current) drug therapy: Secondary | ICD-10-CM | POA: Diagnosis not present

## 2018-01-01 DIAGNOSIS — J449 Chronic obstructive pulmonary disease, unspecified: Secondary | ICD-10-CM | POA: Diagnosis not present

## 2018-01-01 DIAGNOSIS — I129 Hypertensive chronic kidney disease with stage 1 through stage 4 chronic kidney disease, or unspecified chronic kidney disease: Secondary | ICD-10-CM | POA: Diagnosis not present

## 2018-01-01 DIAGNOSIS — N183 Chronic kidney disease, stage 3 (moderate): Secondary | ICD-10-CM | POA: Diagnosis not present

## 2018-01-01 DIAGNOSIS — S6992XA Unspecified injury of left wrist, hand and finger(s), initial encounter: Secondary | ICD-10-CM | POA: Diagnosis not present

## 2018-01-01 DIAGNOSIS — E1122 Type 2 diabetes mellitus with diabetic chronic kidney disease: Secondary | ICD-10-CM | POA: Diagnosis not present

## 2018-01-01 DIAGNOSIS — S0003XA Contusion of scalp, initial encounter: Secondary | ICD-10-CM | POA: Diagnosis not present

## 2018-01-01 DIAGNOSIS — G8929 Other chronic pain: Secondary | ICD-10-CM | POA: Diagnosis not present

## 2018-01-01 DIAGNOSIS — S299XXA Unspecified injury of thorax, initial encounter: Secondary | ICD-10-CM | POA: Diagnosis not present

## 2018-01-01 DIAGNOSIS — Z7984 Long term (current) use of oral hypoglycemic drugs: Secondary | ICD-10-CM | POA: Diagnosis not present

## 2018-01-01 DIAGNOSIS — R55 Syncope and collapse: Secondary | ICD-10-CM | POA: Diagnosis not present

## 2018-01-01 DIAGNOSIS — Z794 Long term (current) use of insulin: Secondary | ICD-10-CM | POA: Diagnosis not present

## 2018-01-01 DIAGNOSIS — M25562 Pain in left knee: Secondary | ICD-10-CM | POA: Diagnosis not present

## 2018-01-01 DIAGNOSIS — S069X9A Unspecified intracranial injury with loss of consciousness of unspecified duration, initial encounter: Secondary | ICD-10-CM | POA: Diagnosis not present

## 2018-01-05 DIAGNOSIS — Z79899 Other long term (current) drug therapy: Secondary | ICD-10-CM | POA: Diagnosis not present

## 2018-01-05 DIAGNOSIS — G894 Chronic pain syndrome: Secondary | ICD-10-CM | POA: Diagnosis not present

## 2018-01-05 DIAGNOSIS — R55 Syncope and collapse: Secondary | ICD-10-CM | POA: Diagnosis not present

## 2018-01-05 DIAGNOSIS — K219 Gastro-esophageal reflux disease without esophagitis: Secondary | ICD-10-CM | POA: Diagnosis not present

## 2018-01-05 DIAGNOSIS — Z5181 Encounter for therapeutic drug level monitoring: Secondary | ICD-10-CM | POA: Diagnosis not present

## 2018-01-12 DIAGNOSIS — J4540 Moderate persistent asthma, uncomplicated: Secondary | ICD-10-CM | POA: Diagnosis not present

## 2018-01-12 DIAGNOSIS — R5383 Other fatigue: Secondary | ICD-10-CM | POA: Diagnosis not present

## 2018-01-12 DIAGNOSIS — G4733 Obstructive sleep apnea (adult) (pediatric): Secondary | ICD-10-CM | POA: Diagnosis not present

## 2018-01-12 DIAGNOSIS — J301 Allergic rhinitis due to pollen: Secondary | ICD-10-CM | POA: Diagnosis not present

## 2018-01-21 ENCOUNTER — Encounter: Payer: Self-pay | Admitting: Cardiology

## 2018-01-26 DIAGNOSIS — G4733 Obstructive sleep apnea (adult) (pediatric): Secondary | ICD-10-CM | POA: Diagnosis not present

## 2018-01-29 ENCOUNTER — Ambulatory Visit (INDEPENDENT_AMBULATORY_CARE_PROVIDER_SITE_OTHER): Payer: Medicare Other | Admitting: Cardiology

## 2018-01-29 ENCOUNTER — Encounter: Payer: Self-pay | Admitting: Cardiology

## 2018-01-29 DIAGNOSIS — E663 Overweight: Secondary | ICD-10-CM

## 2018-01-29 DIAGNOSIS — E109 Type 1 diabetes mellitus without complications: Secondary | ICD-10-CM

## 2018-01-29 DIAGNOSIS — I10 Essential (primary) hypertension: Secondary | ICD-10-CM

## 2018-01-29 DIAGNOSIS — E118 Type 2 diabetes mellitus with unspecified complications: Secondary | ICD-10-CM

## 2018-01-29 DIAGNOSIS — E782 Mixed hyperlipidemia: Secondary | ICD-10-CM

## 2018-01-29 DIAGNOSIS — R55 Syncope and collapse: Secondary | ICD-10-CM | POA: Diagnosis not present

## 2018-01-29 DIAGNOSIS — E1169 Type 2 diabetes mellitus with other specified complication: Secondary | ICD-10-CM | POA: Insufficient documentation

## 2018-01-29 HISTORY — DX: Overweight: E66.3

## 2018-01-29 HISTORY — DX: Syncope and collapse: R55

## 2018-01-29 HISTORY — DX: Mixed hyperlipidemia: E78.2

## 2018-01-29 HISTORY — DX: Essential (primary) hypertension: I10

## 2018-01-29 HISTORY — DX: Type 2 diabetes mellitus with unspecified complications: E11.8

## 2018-01-29 NOTE — Addendum Note (Signed)
Addended by: Craige Cotta on: 01/29/2018 04:45 PM   Modules accepted: Orders

## 2018-01-29 NOTE — Patient Instructions (Signed)
Medication Instructions:  Your physician recommends that you continue on your current medications as directed. Please refer to the Current Medication list given to you today.  If you need a refill on your cardiac medications before your next appointment, please call your pharmacy.   Lab work: None  If you have labs (blood work) drawn today and your tests are completely normal, you will receive your results only by: Marland Kitchen MyChart Message (if you have MyChart) OR . A paper copy in the mail If you have any lab test that is abnormal or we need to change your treatment, we will call you to review the results.  Testing/Procedures: Your physician has requested that you have an echocardiogram. Echocardiography is a painless test that uses sound waves to create images of your heart. It provides your doctor with information about the size and shape of your heart and how well your heart's chambers and valves are working. This procedure takes approximately one hour. There are no restrictions for this procedure.  Your physician has requested that you have a stress echocardiogram. For further information please visit [email or web address] Please follow instruction sheet as given.  Follow-Up: At [facility], you and your health needs are our priority.  As part of our continuing mission to provide you with exceptional heart care, we have created designated Provider Care Teams.  These Care Teams include your primary Cardiologist (physician) and Advanced Practice Providers (APPs -  Physician Assistants and Nurse Practitioners) who all work together to provide you with the care you need, when you need it.  You will need a follow up appointment in 6 months.  Please call our office 2 months in advance to schedule this appointment.  You may see  another member of our [employer] Provider Team in [place]: Gypsy Balsam, MD . Norman Herrlich, MD  Any Other Special Instructions Will Be Listed Below (If  Applicable).

## 2018-01-29 NOTE — Progress Notes (Signed)
Cardiology Office Note:    Date:  01/29/2018   ID:  Sarah Maldonado, DOB June 27, 1970, MRN [id number]  PCP:  Eloisa Northern, MD  Cardiologist:  Garwin Brothers, MD   Referring MD: No ref. provider found    ASSESSMENT:    1. Syncope, unspecified syncope type   2. Essential hypertension   3. Mixed dyslipidemia   4. Type 1 diabetes mellitus without complication (HCC)   5. Type 2 diabetes mellitus with unspecified complications (HCC)   6. Overweight    PLAN:    In order of problems listed above:  1. Prevention stressed with the patient.  Importance of compliance with diet and medication stressed and she vocalized understanding.  Diet was discussed for dyslipidemia and diabetes mellitus.  These issues are followed by her primary care physician. 2. It seems there is a component of orthostatic hypotension/neurally mediated hypotension.  Her TSH done a couple of months ago was unremarkable.  In view of the above I will also order one month event monitor to see if she has any arrhythmias as a cause of this issue.  Echocardiogram will be done with stress to assess for any ischemic etiology.  Fall precautions were advised. 3. Patient will be seen in follow-up appointment in 6 months or earlier if the patient has any concerns    Medication Adjustments/Labs and Tests Ordered: Current medicines are reviewed at length with the patient today.  Concerns regarding medicines are outlined above.  No orders of the defined types were placed in this encounter.  No orders of the defined types were placed in this encounter.    History of Present Illness:    Sarah Maldonado is a 47 y.o. female who is being seen today for the evaluation of syncope.  The patient has past medical history of essential hypertension, dyslipidemia, diabetes mellitus.  She has had a syncopal events in the past.  She typically mentions to me that when she changes posture and gets up in the from a lying down to sitting position or  sitting to standing position quickly she feels dizzy and has passed out.  No chest pain orthopnea or PND.  She is now walking about 5 to 10 minutes on a regular basis without any problems.  She went to [facility] for the same reason and was evaluated and discharged.  I reviewed those notes.  Past Medical History:  Diagnosis Date  . 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 Carpal tunnel syndrome 11/23/2008  . 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 CESAREAN SECTION    . CHOLECYSTECTOMY    . KNEE SURGERY    . UTERINE FIBROID SURGERY      Current Medications: Current Meds  Medication Sig  . amitriptyline (ELAVIL) 75 MG tablet Take 1 tablet by mouth daily.  Marland Kitchen amLODipine (NORVASC) 10 MG tablet Take 1 tablet by mouth daily.  . cloNIDine (CATAPRES) 0.3 MG tablet Take 1 tablet by mouth 3 (three) times daily.  . DULoxetine (CYMBALTA) 60 MG capsule Take 60 mg by mouth daily.  Marland Kitchen esomeprazole (NEXIUM) 40 MG capsule Take 1 capsule by mouth daily.  Marland Kitchen glimepiride (AMARYL) 4 MG tablet Take 1 tablet by mouth daily.  . hydrALAZINE (APRESOLINE) 100 MG tablet Take 1 tablet by mouth daily.  Marland Kitchen HYDROcodone-acetaminophen (MAXIDONE) 10-750 MG per tablet Take 1 tablet by mouth every 6 (six) hours as needed. For pain   . JANUVIA  100 MG tablet Take 1 tablet by mouth daily.  . metoprolol tartrate (LOPRESSOR) 100 MG tablet Take 1 tablet by mouth 2 (two) times daily.  . montelukast (SINGULAIR) 10 MG tablet Take 10 mg by mouth at bedtime.  Marland Kitchen olmesartan-hydrochlorothiazide (BENICAR HCT) 40-25 MG tablet Take 1 tablet by mouth daily.  . pregabalin (LYRICA) 100 MG capsule Take 1 capsule by mouth 3 (three) times daily.  Marland Kitchen PROAIR HFA 108 (90 Base) MCG/ACT inhaler Inhale 2 puffs into the lungs every 4 (four) hours as needed.   . rosuvastatin (CRESTOR) 40 MG tablet Take 40 mg by mouth daily.  . Semaglutide,0.25 or 0.5MG /DOS, (OZEMPIC, 0.25  OR 0.5 MG/DOSE,) 2 MG/1.5ML SOPN Inject into the skin.  . TRESIBA FLEXTOUCH 100 UNIT/ML SOPN FlexTouch Pen INJ 65 UNITS [place] D     Allergies:   Amoxicillin; Contrast media [iodinated diagnostic agents]; and Penicillins   Social History   Socioeconomic History  . Marital status: Legally Separated    Spouse name: Not on file  . Number of children: Not on file  . Years of education: Not on file  . Highest education level: Not on file  Occupational History  . Not on file  Social Needs  . Financial resource strain: Not on file  . Food insecurity:    Worry: Not on file    Inability: Not on file  . Transportation needs:    Medical: Not on file    Non-medical: Not on file  Tobacco Use  . Smoking status: Never Smoker  . Smokeless tobacco: Never Used  Substance and Sexual Activity  . Alcohol use: Not on file  . Drug use: Not on file  . Sexual activity: Not on file  Lifestyle  . Physical activity:    Days per week: Not on file    Minutes per session: Not on file  . Stress: Not on file  Relationships  . Social connections:    Talks on phone: Not on file    Gets together: Not on file    Attends religious service: Not on file    Active member of club or organization: Not on file    Attends meetings of clubs or organizations: Not on file    Relationship status: Not on file  Other Topics Concern  . Not on file  Social History Narrative  . Not on file     Family History: The patient's family history is not on file.  ROS:   Please see the history of present illness.    All other systems reviewed and are negative.  EKGs/Labs/Other Studies Reviewed:    The following studies were reviewed today: EKG reveals sinus rhythm and nonspecific ST-T changes.   Recent Labs: No results found for requested labs within last 8760 hours.  Recent Lipid Panel No results found for: CHOL, TRIG, HDL, CHOLHDL, VLDL, LDLCALC, LDLDIRECT  Physical Exam:    VS:  BP 118/72 (BP Location: Right  Arm, Patient Position: Sitting, Cuff Size: Large)   Pulse (!) 109   Ht 5\' 5"  (1.651 m)   Wt 247 lb (112 kg)   SpO2 96%   BMI 41.10 kg/m     Wt Readings from Last 3 Encounters:  01/29/18 247 lb (112 kg)     GEN: Patient is in no acute distress HEENT: Normal NECK: No JVD; No carotid bruits LYMPHATICS: No lymphadenopathy CARDIAC: S1 S2 regular, 2/6 systolic murmur at the apex. RESPIRATORY:  Clear to auscultation without rales, wheezing or rhonchi  ABDOMEN:  Soft, non-tender, non-distended MUSCULOSKELETAL:  No edema; No deformity  SKIN: Warm and dry NEUROLOGIC:  Alert and oriented x 3 PSYCHIATRIC:  Normal affect    Signed, Garwin Brothers, MD  01/29/2018 4:29 PM    [facility]

## 2018-01-30 DIAGNOSIS — E782 Mixed hyperlipidemia: Secondary | ICD-10-CM | POA: Diagnosis not present

## 2018-01-30 DIAGNOSIS — Z794 Long term (current) use of insulin: Secondary | ICD-10-CM | POA: Diagnosis not present

## 2018-01-30 DIAGNOSIS — E1165 Type 2 diabetes mellitus with hyperglycemia: Secondary | ICD-10-CM | POA: Diagnosis not present

## 2018-01-30 DIAGNOSIS — E0540 Thyrotoxicosis factitia without thyrotoxic crisis or storm: Secondary | ICD-10-CM | POA: Diagnosis not present

## 2018-02-02 DIAGNOSIS — E0840 Diabetes mellitus due to underlying condition with diabetic neuropathy, unspecified: Secondary | ICD-10-CM | POA: Diagnosis not present

## 2018-02-02 DIAGNOSIS — E782 Mixed hyperlipidemia: Secondary | ICD-10-CM | POA: Diagnosis not present

## 2018-02-02 DIAGNOSIS — Z5181 Encounter for therapeutic drug level monitoring: Secondary | ICD-10-CM | POA: Diagnosis not present

## 2018-02-02 DIAGNOSIS — G894 Chronic pain syndrome: Secondary | ICD-10-CM | POA: Diagnosis not present

## 2018-02-02 DIAGNOSIS — E1165 Type 2 diabetes mellitus with hyperglycemia: Secondary | ICD-10-CM | POA: Diagnosis not present

## 2018-02-06 ENCOUNTER — Other Ambulatory Visit: Payer: Medicare Other

## 2018-02-06 ENCOUNTER — Telehealth: Payer: Self-pay | Admitting: *Deleted

## 2018-02-06 ENCOUNTER — Ambulatory Visit: Payer: Medicare Other

## 2018-02-06 NOTE — Telephone Encounter (Signed)
Pt came in today for 30 day event monitor. Unable to monitor to connect to [employer]. Phoned [employer] and they said to put strip vertical and see if that would work, it did not. Called Morrie Sheldon about putting an event Zio on pt. She said that would be good. Put the event Zio on and called company to make sure it was connecting. Person there asked me to do several things and it never would connect so she said to send it back, that it was defective. We rescheduled the pt for her monitor to be put on 11/20 when she comes in for the stress echo.

## 2018-02-11 ENCOUNTER — Ambulatory Visit: Payer: Medicare Other

## 2018-02-11 ENCOUNTER — Ambulatory Visit (INDEPENDENT_AMBULATORY_CARE_PROVIDER_SITE_OTHER): Payer: Medicare Other

## 2018-02-11 DIAGNOSIS — E1165 Type 2 diabetes mellitus with hyperglycemia: Secondary | ICD-10-CM | POA: Diagnosis not present

## 2018-02-11 DIAGNOSIS — E1122 Type 2 diabetes mellitus with diabetic chronic kidney disease: Secondary | ICD-10-CM | POA: Diagnosis not present

## 2018-02-11 DIAGNOSIS — Z79899 Other long term (current) drug therapy: Secondary | ICD-10-CM | POA: Diagnosis not present

## 2018-02-11 DIAGNOSIS — R531 Weakness: Secondary | ICD-10-CM | POA: Diagnosis not present

## 2018-02-11 DIAGNOSIS — R55 Syncope and collapse: Secondary | ICD-10-CM

## 2018-02-11 DIAGNOSIS — N183 Chronic kidney disease, stage 3 (moderate): Secondary | ICD-10-CM | POA: Diagnosis not present

## 2018-02-11 DIAGNOSIS — Z794 Long term (current) use of insulin: Secondary | ICD-10-CM | POA: Diagnosis not present

## 2018-02-11 DIAGNOSIS — I10 Essential (primary) hypertension: Secondary | ICD-10-CM

## 2018-02-11 DIAGNOSIS — Z7984 Long term (current) use of oral hypoglycemic drugs: Secondary | ICD-10-CM | POA: Diagnosis not present

## 2018-02-11 DIAGNOSIS — J449 Chronic obstructive pulmonary disease, unspecified: Secondary | ICD-10-CM | POA: Diagnosis not present

## 2018-02-11 DIAGNOSIS — I129 Hypertensive chronic kidney disease with stage 1 through stage 4 chronic kidney disease, or unspecified chronic kidney disease: Secondary | ICD-10-CM | POA: Diagnosis not present

## 2018-02-11 DIAGNOSIS — G8929 Other chronic pain: Secondary | ICD-10-CM | POA: Diagnosis not present

## 2018-02-11 DIAGNOSIS — R079 Chest pain, unspecified: Secondary | ICD-10-CM | POA: Diagnosis not present

## 2018-02-11 NOTE — Progress Notes (Signed)
Stress echocardiogram with limited echocardiogram has been performed.  Jimmy Tavon Corriher [facility], RVT

## 2018-02-12 ENCOUNTER — Telehealth: Payer: Self-pay | Admitting: Cardiology

## 2018-02-12 NOTE — Telephone Encounter (Signed)
Wants to know if she needs to reschedule her stress echo and monitor from yesterday

## 2018-02-12 NOTE — Telephone Encounter (Signed)
I would like for her.  An appointment with the primary care physician to settle down her issues of diabetes mellitus and then she has an appointment for an evaluation for the same.

## 2018-02-12 NOTE — Progress Notes (Unsigned)
Yesterday on 02/11/18 Patient was here for stress echocardiogram. Patient's blood pressure laying down was 106/70 and heart rate 96 and her standing blood pressure was 85/62 and heart rate 98 and she complained of dizziness at this time. I brought this to Dr. Tomie China and Dr. Vanetta Shawl attention. Dr. Tomie China advised we continued with stress echocardiogram. Patient only exercised for 30 seconds and she became too dizzy to continue. We laid her down and her blood pressure at that time was stable 113/67 and heart rate was 96. We checked her blood sugar and it was 563. Dr. Bing Matter, DOD advised we call ems. She was transported to [facility]

## 2018-02-12 NOTE — Telephone Encounter (Signed)
Left a voicemail informing patient that she would need to first see her PCP regarding proper management of her diabetes then come see Dr. Tomie China afterwards.

## 2018-02-12 NOTE — Telephone Encounter (Signed)
This the patient that had the CBG in the 500's yesterday and only completed 30 seconds of the stress echo. Please advise.

## 2018-02-25 DIAGNOSIS — G4733 Obstructive sleep apnea (adult) (pediatric): Secondary | ICD-10-CM | POA: Diagnosis not present

## 2018-03-02 DIAGNOSIS — Z5181 Encounter for therapeutic drug level monitoring: Secondary | ICD-10-CM | POA: Diagnosis not present

## 2018-03-02 DIAGNOSIS — G894 Chronic pain syndrome: Secondary | ICD-10-CM | POA: Diagnosis not present

## 2018-03-02 DIAGNOSIS — E782 Mixed hyperlipidemia: Secondary | ICD-10-CM | POA: Diagnosis not present

## 2018-03-02 DIAGNOSIS — E0840 Diabetes mellitus due to underlying condition with diabetic neuropathy, unspecified: Secondary | ICD-10-CM | POA: Diagnosis not present

## 2018-03-02 DIAGNOSIS — E1165 Type 2 diabetes mellitus with hyperglycemia: Secondary | ICD-10-CM | POA: Diagnosis not present

## 2018-03-13 DIAGNOSIS — K529 Noninfective gastroenteritis and colitis, unspecified: Secondary | ICD-10-CM | POA: Diagnosis not present

## 2018-03-13 DIAGNOSIS — N200 Calculus of kidney: Secondary | ICD-10-CM | POA: Diagnosis not present

## 2018-03-13 DIAGNOSIS — M545 Low back pain: Secondary | ICD-10-CM | POA: Diagnosis not present

## 2018-03-13 DIAGNOSIS — E1165 Type 2 diabetes mellitus with hyperglycemia: Secondary | ICD-10-CM | POA: Diagnosis not present

## 2018-03-28 DIAGNOSIS — G4733 Obstructive sleep apnea (adult) (pediatric): Secondary | ICD-10-CM | POA: Diagnosis not present

## 2018-03-30 DIAGNOSIS — M549 Dorsalgia, unspecified: Secondary | ICD-10-CM | POA: Diagnosis not present

## 2018-03-30 DIAGNOSIS — G894 Chronic pain syndrome: Secondary | ICD-10-CM | POA: Diagnosis not present

## 2018-03-30 DIAGNOSIS — K529 Noninfective gastroenteritis and colitis, unspecified: Secondary | ICD-10-CM | POA: Diagnosis not present

## 2018-03-30 DIAGNOSIS — Z5181 Encounter for therapeutic drug level monitoring: Secondary | ICD-10-CM | POA: Diagnosis not present

## 2018-03-30 DIAGNOSIS — Z79899 Other long term (current) drug therapy: Secondary | ICD-10-CM | POA: Diagnosis not present

## 2018-04-06 DIAGNOSIS — J301 Allergic rhinitis due to pollen: Secondary | ICD-10-CM | POA: Diagnosis not present

## 2018-04-06 DIAGNOSIS — J4540 Moderate persistent asthma, uncomplicated: Secondary | ICD-10-CM | POA: Diagnosis not present

## 2018-04-06 DIAGNOSIS — R5383 Other fatigue: Secondary | ICD-10-CM | POA: Diagnosis not present

## 2018-04-06 DIAGNOSIS — G4733 Obstructive sleep apnea (adult) (pediatric): Secondary | ICD-10-CM | POA: Diagnosis not present

## 2018-04-14 DIAGNOSIS — E1165 Type 2 diabetes mellitus with hyperglycemia: Secondary | ICD-10-CM | POA: Diagnosis not present

## 2018-04-14 DIAGNOSIS — M25562 Pain in left knee: Secondary | ICD-10-CM | POA: Diagnosis not present

## 2018-04-14 DIAGNOSIS — S8992XA Unspecified injury of left lower leg, initial encounter: Secondary | ICD-10-CM | POA: Diagnosis not present

## 2018-04-14 DIAGNOSIS — S8392XA Sprain of unspecified site of left knee, initial encounter: Secondary | ICD-10-CM | POA: Diagnosis not present

## 2018-04-14 DIAGNOSIS — E119 Type 2 diabetes mellitus without complications: Secondary | ICD-10-CM | POA: Diagnosis not present

## 2018-04-27 DIAGNOSIS — E1140 Type 2 diabetes mellitus with diabetic neuropathy, unspecified: Secondary | ICD-10-CM | POA: Diagnosis not present

## 2018-04-27 DIAGNOSIS — Z0389 Encounter for observation for other suspected diseases and conditions ruled out: Secondary | ICD-10-CM | POA: Diagnosis not present

## 2018-04-27 DIAGNOSIS — Z5181 Encounter for therapeutic drug level monitoring: Secondary | ICD-10-CM | POA: Diagnosis not present

## 2018-04-27 DIAGNOSIS — G894 Chronic pain syndrome: Secondary | ICD-10-CM | POA: Diagnosis not present

## 2018-04-27 DIAGNOSIS — W19XXXD Unspecified fall, subsequent encounter: Secondary | ICD-10-CM | POA: Diagnosis not present

## 2018-04-28 DIAGNOSIS — G4733 Obstructive sleep apnea (adult) (pediatric): Secondary | ICD-10-CM | POA: Diagnosis not present

## 2018-05-23 DIAGNOSIS — S39012A Strain of muscle, fascia and tendon of lower back, initial encounter: Secondary | ICD-10-CM | POA: Diagnosis not present

## 2018-05-23 DIAGNOSIS — M545 Low back pain: Secondary | ICD-10-CM | POA: Diagnosis not present

## 2018-05-23 DIAGNOSIS — M5442 Lumbago with sciatica, left side: Secondary | ICD-10-CM | POA: Diagnosis not present

## 2018-05-23 DIAGNOSIS — R0902 Hypoxemia: Secondary | ICD-10-CM | POA: Diagnosis not present

## 2018-05-23 DIAGNOSIS — M5489 Other dorsalgia: Secondary | ICD-10-CM | POA: Diagnosis not present

## 2018-05-25 DIAGNOSIS — E1165 Type 2 diabetes mellitus with hyperglycemia: Secondary | ICD-10-CM | POA: Diagnosis not present

## 2018-05-25 DIAGNOSIS — E1140 Type 2 diabetes mellitus with diabetic neuropathy, unspecified: Secondary | ICD-10-CM | POA: Diagnosis not present

## 2018-05-25 DIAGNOSIS — Z5181 Encounter for therapeutic drug level monitoring: Secondary | ICD-10-CM | POA: Diagnosis not present

## 2018-05-25 DIAGNOSIS — G894 Chronic pain syndrome: Secondary | ICD-10-CM | POA: Diagnosis not present

## 2018-05-25 DIAGNOSIS — Z794 Long term (current) use of insulin: Secondary | ICD-10-CM | POA: Diagnosis not present

## 2018-05-27 DIAGNOSIS — G4733 Obstructive sleep apnea (adult) (pediatric): Secondary | ICD-10-CM | POA: Diagnosis not present

## 2018-05-29 DIAGNOSIS — R531 Weakness: Secondary | ICD-10-CM | POA: Diagnosis not present

## 2018-05-29 DIAGNOSIS — Z794 Long term (current) use of insulin: Secondary | ICD-10-CM | POA: Diagnosis not present

## 2018-05-29 DIAGNOSIS — E860 Dehydration: Secondary | ICD-10-CM | POA: Diagnosis not present

## 2018-05-29 DIAGNOSIS — E1165 Type 2 diabetes mellitus with hyperglycemia: Secondary | ICD-10-CM | POA: Diagnosis not present

## 2018-05-29 DIAGNOSIS — I129 Hypertensive chronic kidney disease with stage 1 through stage 4 chronic kidney disease, or unspecified chronic kidney disease: Secondary | ICD-10-CM | POA: Diagnosis not present

## 2018-05-29 DIAGNOSIS — A419 Sepsis, unspecified organism: Secondary | ICD-10-CM | POA: Diagnosis not present

## 2018-05-29 DIAGNOSIS — Z79899 Other long term (current) drug therapy: Secondary | ICD-10-CM | POA: Diagnosis not present

## 2018-05-29 DIAGNOSIS — R400 Somnolence: Secondary | ICD-10-CM | POA: Diagnosis not present

## 2018-05-29 DIAGNOSIS — Z79891 Long term (current) use of opiate analgesic: Secondary | ICD-10-CM | POA: Diagnosis not present

## 2018-05-29 DIAGNOSIS — I959 Hypotension, unspecified: Secondary | ICD-10-CM | POA: Diagnosis not present

## 2018-05-29 DIAGNOSIS — N183 Chronic kidney disease, stage 3 (moderate): Secondary | ICD-10-CM | POA: Diagnosis not present

## 2018-05-29 DIAGNOSIS — R112 Nausea with vomiting, unspecified: Secondary | ICD-10-CM | POA: Diagnosis not present

## 2018-05-29 DIAGNOSIS — J9811 Atelectasis: Secondary | ICD-10-CM | POA: Diagnosis not present

## 2018-05-29 DIAGNOSIS — G8929 Other chronic pain: Secondary | ICD-10-CM | POA: Diagnosis not present

## 2018-05-29 DIAGNOSIS — T424X1A Poisoning by benzodiazepines, accidental (unintentional), initial encounter: Secondary | ICD-10-CM | POA: Diagnosis not present

## 2018-05-29 DIAGNOSIS — R5383 Other fatigue: Secondary | ICD-10-CM | POA: Diagnosis not present

## 2018-05-29 DIAGNOSIS — R404 Transient alteration of awareness: Secondary | ICD-10-CM | POA: Diagnosis not present

## 2018-05-29 DIAGNOSIS — J449 Chronic obstructive pulmonary disease, unspecified: Secondary | ICD-10-CM | POA: Diagnosis not present

## 2018-05-29 DIAGNOSIS — Z7984 Long term (current) use of oral hypoglycemic drugs: Secondary | ICD-10-CM | POA: Diagnosis not present

## 2018-05-29 DIAGNOSIS — E1122 Type 2 diabetes mellitus with diabetic chronic kidney disease: Secondary | ICD-10-CM | POA: Diagnosis not present

## 2018-06-15 DIAGNOSIS — E1140 Type 2 diabetes mellitus with diabetic neuropathy, unspecified: Secondary | ICD-10-CM | POA: Diagnosis not present

## 2018-06-15 DIAGNOSIS — E1165 Type 2 diabetes mellitus with hyperglycemia: Secondary | ICD-10-CM | POA: Diagnosis not present

## 2018-06-15 DIAGNOSIS — Z794 Long term (current) use of insulin: Secondary | ICD-10-CM | POA: Diagnosis not present

## 2018-06-15 DIAGNOSIS — R569 Unspecified convulsions: Secondary | ICD-10-CM | POA: Diagnosis not present

## 2018-06-21 DIAGNOSIS — Z79899 Other long term (current) drug therapy: Secondary | ICD-10-CM | POA: Diagnosis not present

## 2018-06-21 DIAGNOSIS — N189 Chronic kidney disease, unspecified: Secondary | ICD-10-CM | POA: Diagnosis not present

## 2018-06-21 DIAGNOSIS — E1122 Type 2 diabetes mellitus with diabetic chronic kidney disease: Secondary | ICD-10-CM | POA: Diagnosis not present

## 2018-06-21 DIAGNOSIS — Z8709 Personal history of other diseases of the respiratory system: Secondary | ICD-10-CM | POA: Diagnosis not present

## 2018-06-21 DIAGNOSIS — B9689 Other specified bacterial agents as the cause of diseases classified elsewhere: Secondary | ICD-10-CM | POA: Diagnosis not present

## 2018-06-21 DIAGNOSIS — R160 Hepatomegaly, not elsewhere classified: Secondary | ICD-10-CM | POA: Diagnosis not present

## 2018-06-21 DIAGNOSIS — L03116 Cellulitis of left lower limb: Secondary | ICD-10-CM | POA: Diagnosis not present

## 2018-06-21 DIAGNOSIS — Z794 Long term (current) use of insulin: Secondary | ICD-10-CM | POA: Diagnosis not present

## 2018-06-21 DIAGNOSIS — Z7984 Long term (current) use of oral hypoglycemic drugs: Secondary | ICD-10-CM | POA: Diagnosis not present

## 2018-06-21 DIAGNOSIS — I129 Hypertensive chronic kidney disease with stage 1 through stage 4 chronic kidney disease, or unspecified chronic kidney disease: Secondary | ICD-10-CM | POA: Diagnosis not present

## 2018-06-21 DIAGNOSIS — Z79891 Long term (current) use of opiate analgesic: Secondary | ICD-10-CM | POA: Diagnosis not present

## 2018-06-21 DIAGNOSIS — G8929 Other chronic pain: Secondary | ICD-10-CM | POA: Diagnosis not present

## 2018-06-21 DIAGNOSIS — N200 Calculus of kidney: Secondary | ICD-10-CM | POA: Diagnosis not present

## 2018-06-22 DIAGNOSIS — Z5181 Encounter for therapeutic drug level monitoring: Secondary | ICD-10-CM | POA: Diagnosis not present

## 2018-06-22 DIAGNOSIS — Z79899 Other long term (current) drug therapy: Secondary | ICD-10-CM | POA: Diagnosis not present

## 2018-06-22 DIAGNOSIS — G894 Chronic pain syndrome: Secondary | ICD-10-CM | POA: Diagnosis not present

## 2018-06-23 DIAGNOSIS — E119 Type 2 diabetes mellitus without complications: Secondary | ICD-10-CM | POA: Diagnosis not present

## 2018-06-23 DIAGNOSIS — L03116 Cellulitis of left lower limb: Secondary | ICD-10-CM | POA: Diagnosis not present

## 2018-06-25 DIAGNOSIS — R52 Pain, unspecified: Secondary | ICD-10-CM | POA: Diagnosis not present

## 2018-06-25 DIAGNOSIS — L02416 Cutaneous abscess of left lower limb: Secondary | ICD-10-CM | POA: Diagnosis not present

## 2018-06-25 DIAGNOSIS — R000 Tachycardia, unspecified: Secondary | ICD-10-CM | POA: Diagnosis not present

## 2018-06-25 DIAGNOSIS — L03116 Cellulitis of left lower limb: Secondary | ICD-10-CM | POA: Diagnosis not present

## 2018-06-27 DIAGNOSIS — G4733 Obstructive sleep apnea (adult) (pediatric): Secondary | ICD-10-CM | POA: Diagnosis not present

## 2018-06-28 DIAGNOSIS — G4733 Obstructive sleep apnea (adult) (pediatric): Secondary | ICD-10-CM | POA: Diagnosis not present

## 2018-06-28 DIAGNOSIS — N183 Chronic kidney disease, stage 3 (moderate): Secondary | ICD-10-CM | POA: Diagnosis not present

## 2018-06-28 DIAGNOSIS — Z79899 Other long term (current) drug therapy: Secondary | ICD-10-CM | POA: Diagnosis not present

## 2018-06-28 DIAGNOSIS — E785 Hyperlipidemia, unspecified: Secondary | ICD-10-CM | POA: Diagnosis not present

## 2018-06-28 DIAGNOSIS — M1990 Unspecified osteoarthritis, unspecified site: Secondary | ICD-10-CM | POA: Diagnosis not present

## 2018-06-28 DIAGNOSIS — B951 Streptococcus, group B, as the cause of diseases classified elsewhere: Secondary | ICD-10-CM | POA: Diagnosis not present

## 2018-06-28 DIAGNOSIS — E876 Hypokalemia: Secondary | ICD-10-CM | POA: Diagnosis not present

## 2018-06-28 DIAGNOSIS — Z794 Long term (current) use of insulin: Secondary | ICD-10-CM | POA: Diagnosis not present

## 2018-06-28 DIAGNOSIS — Z91041 Radiographic dye allergy status: Secondary | ICD-10-CM | POA: Diagnosis not present

## 2018-06-28 DIAGNOSIS — R29898 Other symptoms and signs involving the musculoskeletal system: Secondary | ICD-10-CM | POA: Diagnosis not present

## 2018-06-28 DIAGNOSIS — I129 Hypertensive chronic kidney disease with stage 1 through stage 4 chronic kidney disease, or unspecified chronic kidney disease: Secondary | ICD-10-CM | POA: Diagnosis not present

## 2018-06-28 DIAGNOSIS — E871 Hypo-osmolality and hyponatremia: Secondary | ICD-10-CM | POA: Diagnosis not present

## 2018-06-28 DIAGNOSIS — Z882 Allergy status to sulfonamides status: Secondary | ICD-10-CM | POA: Diagnosis not present

## 2018-06-28 DIAGNOSIS — E1165 Type 2 diabetes mellitus with hyperglycemia: Secondary | ICD-10-CM | POA: Diagnosis not present

## 2018-06-28 DIAGNOSIS — E1122 Type 2 diabetes mellitus with diabetic chronic kidney disease: Secondary | ICD-10-CM | POA: Diagnosis not present

## 2018-06-28 DIAGNOSIS — Z880 Allergy status to penicillin: Secondary | ICD-10-CM | POA: Diagnosis not present

## 2018-06-28 DIAGNOSIS — J449 Chronic obstructive pulmonary disease, unspecified: Secondary | ICD-10-CM | POA: Diagnosis not present

## 2018-06-28 DIAGNOSIS — E669 Obesity, unspecified: Secondary | ICD-10-CM

## 2018-06-28 DIAGNOSIS — L03116 Cellulitis of left lower limb: Secondary | ICD-10-CM | POA: Diagnosis not present

## 2018-06-28 DIAGNOSIS — M79606 Pain in leg, unspecified: Secondary | ICD-10-CM | POA: Diagnosis not present

## 2018-06-28 DIAGNOSIS — R5381 Other malaise: Secondary | ICD-10-CM | POA: Diagnosis not present

## 2018-06-28 DIAGNOSIS — L02416 Cutaneous abscess of left lower limb: Secondary | ICD-10-CM | POA: Diagnosis not present

## 2018-06-28 DIAGNOSIS — B9689 Other specified bacterial agents as the cause of diseases classified elsewhere: Secondary | ICD-10-CM | POA: Diagnosis not present

## 2018-06-28 DIAGNOSIS — I10 Essential (primary) hypertension: Secondary | ICD-10-CM | POA: Diagnosis not present

## 2018-06-28 DIAGNOSIS — G733 Myasthenic syndromes in other diseases classified elsewhere: Secondary | ICD-10-CM

## 2018-06-28 DIAGNOSIS — E119 Type 2 diabetes mellitus without complications: Secondary | ICD-10-CM | POA: Diagnosis not present

## 2018-07-01 DIAGNOSIS — J449 Chronic obstructive pulmonary disease, unspecified: Secondary | ICD-10-CM | POA: Diagnosis not present

## 2018-07-01 DIAGNOSIS — M1991 Primary osteoarthritis, unspecified site: Secondary | ICD-10-CM | POA: Diagnosis not present

## 2018-07-01 DIAGNOSIS — L02416 Cutaneous abscess of left lower limb: Secondary | ICD-10-CM | POA: Diagnosis not present

## 2018-07-01 DIAGNOSIS — E876 Hypokalemia: Secondary | ICD-10-CM | POA: Diagnosis not present

## 2018-07-01 DIAGNOSIS — G4733 Obstructive sleep apnea (adult) (pediatric): Secondary | ICD-10-CM | POA: Diagnosis not present

## 2018-07-01 DIAGNOSIS — E871 Hypo-osmolality and hyponatremia: Secondary | ICD-10-CM | POA: Diagnosis not present

## 2018-07-01 DIAGNOSIS — L03116 Cellulitis of left lower limb: Secondary | ICD-10-CM | POA: Diagnosis not present

## 2018-07-01 DIAGNOSIS — Z9181 History of falling: Secondary | ICD-10-CM | POA: Diagnosis not present

## 2018-07-01 DIAGNOSIS — N183 Chronic kidney disease, stage 3 (moderate): Secondary | ICD-10-CM | POA: Diagnosis not present

## 2018-07-01 DIAGNOSIS — G8929 Other chronic pain: Secondary | ICD-10-CM | POA: Diagnosis not present

## 2018-07-01 DIAGNOSIS — E1122 Type 2 diabetes mellitus with diabetic chronic kidney disease: Secondary | ICD-10-CM | POA: Diagnosis not present

## 2018-07-01 DIAGNOSIS — E7800 Pure hypercholesterolemia, unspecified: Secondary | ICD-10-CM | POA: Diagnosis not present

## 2018-07-01 DIAGNOSIS — R569 Unspecified convulsions: Secondary | ICD-10-CM | POA: Diagnosis not present

## 2018-07-01 DIAGNOSIS — Z79899 Other long term (current) drug therapy: Secondary | ICD-10-CM | POA: Diagnosis not present

## 2018-07-01 DIAGNOSIS — I129 Hypertensive chronic kidney disease with stage 1 through stage 4 chronic kidney disease, or unspecified chronic kidney disease: Secondary | ICD-10-CM | POA: Diagnosis not present

## 2018-07-02 DIAGNOSIS — E1122 Type 2 diabetes mellitus with diabetic chronic kidney disease: Secondary | ICD-10-CM | POA: Diagnosis not present

## 2018-07-02 DIAGNOSIS — G4733 Obstructive sleep apnea (adult) (pediatric): Secondary | ICD-10-CM | POA: Diagnosis not present

## 2018-07-02 DIAGNOSIS — E871 Hypo-osmolality and hyponatremia: Secondary | ICD-10-CM | POA: Diagnosis not present

## 2018-07-02 DIAGNOSIS — E7800 Pure hypercholesterolemia, unspecified: Secondary | ICD-10-CM | POA: Diagnosis not present

## 2018-07-02 DIAGNOSIS — I129 Hypertensive chronic kidney disease with stage 1 through stage 4 chronic kidney disease, or unspecified chronic kidney disease: Secondary | ICD-10-CM | POA: Diagnosis not present

## 2018-07-02 DIAGNOSIS — L02416 Cutaneous abscess of left lower limb: Secondary | ICD-10-CM | POA: Diagnosis not present

## 2018-07-02 DIAGNOSIS — L03116 Cellulitis of left lower limb: Secondary | ICD-10-CM | POA: Diagnosis not present

## 2018-07-02 DIAGNOSIS — G8929 Other chronic pain: Secondary | ICD-10-CM | POA: Diagnosis not present

## 2018-07-02 DIAGNOSIS — M1991 Primary osteoarthritis, unspecified site: Secondary | ICD-10-CM | POA: Diagnosis not present

## 2018-07-02 DIAGNOSIS — N183 Chronic kidney disease, stage 3 (moderate): Secondary | ICD-10-CM | POA: Diagnosis not present

## 2018-07-02 DIAGNOSIS — J449 Chronic obstructive pulmonary disease, unspecified: Secondary | ICD-10-CM | POA: Diagnosis not present

## 2018-07-02 DIAGNOSIS — R569 Unspecified convulsions: Secondary | ICD-10-CM | POA: Diagnosis not present

## 2018-07-02 DIAGNOSIS — E876 Hypokalemia: Secondary | ICD-10-CM | POA: Diagnosis not present

## 2018-07-02 DIAGNOSIS — Z79899 Other long term (current) drug therapy: Secondary | ICD-10-CM | POA: Diagnosis not present

## 2018-07-02 DIAGNOSIS — Z9181 History of falling: Secondary | ICD-10-CM | POA: Diagnosis not present

## 2018-07-07 DIAGNOSIS — L02416 Cutaneous abscess of left lower limb: Secondary | ICD-10-CM | POA: Diagnosis not present

## 2018-07-07 DIAGNOSIS — E871 Hypo-osmolality and hyponatremia: Secondary | ICD-10-CM | POA: Diagnosis not present

## 2018-07-07 DIAGNOSIS — E7800 Pure hypercholesterolemia, unspecified: Secondary | ICD-10-CM | POA: Diagnosis not present

## 2018-07-07 DIAGNOSIS — I129 Hypertensive chronic kidney disease with stage 1 through stage 4 chronic kidney disease, or unspecified chronic kidney disease: Secondary | ICD-10-CM | POA: Diagnosis not present

## 2018-07-07 DIAGNOSIS — N183 Chronic kidney disease, stage 3 (moderate): Secondary | ICD-10-CM | POA: Diagnosis not present

## 2018-07-07 DIAGNOSIS — E1122 Type 2 diabetes mellitus with diabetic chronic kidney disease: Secondary | ICD-10-CM | POA: Diagnosis not present

## 2018-07-07 DIAGNOSIS — Z9181 History of falling: Secondary | ICD-10-CM | POA: Diagnosis not present

## 2018-07-07 DIAGNOSIS — J449 Chronic obstructive pulmonary disease, unspecified: Secondary | ICD-10-CM | POA: Diagnosis not present

## 2018-07-07 DIAGNOSIS — M1991 Primary osteoarthritis, unspecified site: Secondary | ICD-10-CM | POA: Diagnosis not present

## 2018-07-07 DIAGNOSIS — G4733 Obstructive sleep apnea (adult) (pediatric): Secondary | ICD-10-CM | POA: Diagnosis not present

## 2018-07-07 DIAGNOSIS — Z79899 Other long term (current) drug therapy: Secondary | ICD-10-CM | POA: Diagnosis not present

## 2018-07-07 DIAGNOSIS — G8929 Other chronic pain: Secondary | ICD-10-CM | POA: Diagnosis not present

## 2018-07-07 DIAGNOSIS — L03116 Cellulitis of left lower limb: Secondary | ICD-10-CM | POA: Diagnosis not present

## 2018-07-07 DIAGNOSIS — R569 Unspecified convulsions: Secondary | ICD-10-CM | POA: Diagnosis not present

## 2018-07-07 DIAGNOSIS — E876 Hypokalemia: Secondary | ICD-10-CM | POA: Diagnosis not present

## 2018-07-08 ENCOUNTER — Telehealth: Payer: Self-pay

## 2018-07-08 NOTE — Telephone Encounter (Signed)
RN called patient and emailed consent for telemedicine visit via outlook. Patient instructed to have home BP cuff available, weigh herself and all medications out for reconciliation/refills. Patient scheduled for virtual visit and had no further questions.

## 2018-07-09 DIAGNOSIS — Z09 Encounter for follow-up examination after completed treatment for conditions other than malignant neoplasm: Secondary | ICD-10-CM | POA: Insufficient documentation

## 2018-07-09 DIAGNOSIS — L02416 Cutaneous abscess of left lower limb: Secondary | ICD-10-CM | POA: Insufficient documentation

## 2018-07-09 HISTORY — DX: Encounter for follow-up examination after completed treatment for conditions other than malignant neoplasm: Z09

## 2018-07-09 HISTORY — DX: Cutaneous abscess of left lower limb: L02.416

## 2018-07-10 ENCOUNTER — Telehealth: Payer: Self-pay

## 2018-07-10 ENCOUNTER — Other Ambulatory Visit: Payer: Self-pay

## 2018-07-10 ENCOUNTER — Telehealth: Payer: Medicare Other | Admitting: Cardiology

## 2018-07-10 NOTE — Telephone Encounter (Signed)
Called patient twice and left voicemail for appointment today through webex will continue efforts.

## 2018-07-13 DIAGNOSIS — E1122 Type 2 diabetes mellitus with diabetic chronic kidney disease: Secondary | ICD-10-CM | POA: Diagnosis not present

## 2018-07-13 DIAGNOSIS — I129 Hypertensive chronic kidney disease with stage 1 through stage 4 chronic kidney disease, or unspecified chronic kidney disease: Secondary | ICD-10-CM | POA: Diagnosis not present

## 2018-07-13 DIAGNOSIS — L02416 Cutaneous abscess of left lower limb: Secondary | ICD-10-CM | POA: Diagnosis not present

## 2018-07-13 DIAGNOSIS — M1991 Primary osteoarthritis, unspecified site: Secondary | ICD-10-CM | POA: Diagnosis not present

## 2018-07-13 DIAGNOSIS — E871 Hypo-osmolality and hyponatremia: Secondary | ICD-10-CM | POA: Diagnosis not present

## 2018-07-13 DIAGNOSIS — L03116 Cellulitis of left lower limb: Secondary | ICD-10-CM | POA: Diagnosis not present

## 2018-07-13 DIAGNOSIS — E876 Hypokalemia: Secondary | ICD-10-CM | POA: Diagnosis not present

## 2018-07-13 DIAGNOSIS — J449 Chronic obstructive pulmonary disease, unspecified: Secondary | ICD-10-CM | POA: Diagnosis not present

## 2018-07-13 DIAGNOSIS — G4733 Obstructive sleep apnea (adult) (pediatric): Secondary | ICD-10-CM | POA: Diagnosis not present

## 2018-07-13 DIAGNOSIS — Z9181 History of falling: Secondary | ICD-10-CM | POA: Diagnosis not present

## 2018-07-13 DIAGNOSIS — Z79899 Other long term (current) drug therapy: Secondary | ICD-10-CM | POA: Diagnosis not present

## 2018-07-13 DIAGNOSIS — R569 Unspecified convulsions: Secondary | ICD-10-CM | POA: Diagnosis not present

## 2018-07-13 DIAGNOSIS — E7800 Pure hypercholesterolemia, unspecified: Secondary | ICD-10-CM | POA: Diagnosis not present

## 2018-07-13 DIAGNOSIS — N183 Chronic kidney disease, stage 3 (moderate): Secondary | ICD-10-CM | POA: Diagnosis not present

## 2018-07-13 DIAGNOSIS — G8929 Other chronic pain: Secondary | ICD-10-CM | POA: Diagnosis not present

## 2018-07-14 DIAGNOSIS — Z79899 Other long term (current) drug therapy: Secondary | ICD-10-CM | POA: Diagnosis not present

## 2018-07-14 DIAGNOSIS — E876 Hypokalemia: Secondary | ICD-10-CM | POA: Diagnosis not present

## 2018-07-14 DIAGNOSIS — E1122 Type 2 diabetes mellitus with diabetic chronic kidney disease: Secondary | ICD-10-CM | POA: Diagnosis not present

## 2018-07-14 DIAGNOSIS — M1991 Primary osteoarthritis, unspecified site: Secondary | ICD-10-CM | POA: Diagnosis not present

## 2018-07-14 DIAGNOSIS — G4733 Obstructive sleep apnea (adult) (pediatric): Secondary | ICD-10-CM | POA: Diagnosis not present

## 2018-07-14 DIAGNOSIS — J449 Chronic obstructive pulmonary disease, unspecified: Secondary | ICD-10-CM | POA: Diagnosis not present

## 2018-07-14 DIAGNOSIS — Z9181 History of falling: Secondary | ICD-10-CM | POA: Diagnosis not present

## 2018-07-14 DIAGNOSIS — G8929 Other chronic pain: Secondary | ICD-10-CM | POA: Diagnosis not present

## 2018-07-14 DIAGNOSIS — N183 Chronic kidney disease, stage 3 (moderate): Secondary | ICD-10-CM | POA: Diagnosis not present

## 2018-07-14 DIAGNOSIS — E7800 Pure hypercholesterolemia, unspecified: Secondary | ICD-10-CM | POA: Diagnosis not present

## 2018-07-14 DIAGNOSIS — L03116 Cellulitis of left lower limb: Secondary | ICD-10-CM | POA: Diagnosis not present

## 2018-07-14 DIAGNOSIS — R569 Unspecified convulsions: Secondary | ICD-10-CM | POA: Diagnosis not present

## 2018-07-14 DIAGNOSIS — E871 Hypo-osmolality and hyponatremia: Secondary | ICD-10-CM | POA: Diagnosis not present

## 2018-07-14 DIAGNOSIS — I129 Hypertensive chronic kidney disease with stage 1 through stage 4 chronic kidney disease, or unspecified chronic kidney disease: Secondary | ICD-10-CM | POA: Diagnosis not present

## 2018-07-14 DIAGNOSIS — L02416 Cutaneous abscess of left lower limb: Secondary | ICD-10-CM | POA: Diagnosis not present

## 2018-07-21 DIAGNOSIS — E871 Hypo-osmolality and hyponatremia: Secondary | ICD-10-CM | POA: Diagnosis not present

## 2018-07-21 DIAGNOSIS — L03116 Cellulitis of left lower limb: Secondary | ICD-10-CM | POA: Diagnosis not present

## 2018-07-21 DIAGNOSIS — I129 Hypertensive chronic kidney disease with stage 1 through stage 4 chronic kidney disease, or unspecified chronic kidney disease: Secondary | ICD-10-CM | POA: Diagnosis not present

## 2018-07-21 DIAGNOSIS — E7800 Pure hypercholesterolemia, unspecified: Secondary | ICD-10-CM | POA: Diagnosis not present

## 2018-07-21 DIAGNOSIS — R569 Unspecified convulsions: Secondary | ICD-10-CM | POA: Diagnosis not present

## 2018-07-21 DIAGNOSIS — J449 Chronic obstructive pulmonary disease, unspecified: Secondary | ICD-10-CM | POA: Diagnosis not present

## 2018-07-21 DIAGNOSIS — Z79899 Other long term (current) drug therapy: Secondary | ICD-10-CM | POA: Diagnosis not present

## 2018-07-21 DIAGNOSIS — G4733 Obstructive sleep apnea (adult) (pediatric): Secondary | ICD-10-CM | POA: Diagnosis not present

## 2018-07-21 DIAGNOSIS — M1991 Primary osteoarthritis, unspecified site: Secondary | ICD-10-CM | POA: Diagnosis not present

## 2018-07-21 DIAGNOSIS — N183 Chronic kidney disease, stage 3 (moderate): Secondary | ICD-10-CM | POA: Diagnosis not present

## 2018-07-21 DIAGNOSIS — Z9181 History of falling: Secondary | ICD-10-CM | POA: Diagnosis not present

## 2018-07-21 DIAGNOSIS — E876 Hypokalemia: Secondary | ICD-10-CM | POA: Diagnosis not present

## 2018-07-21 DIAGNOSIS — E1122 Type 2 diabetes mellitus with diabetic chronic kidney disease: Secondary | ICD-10-CM | POA: Diagnosis not present

## 2018-07-21 DIAGNOSIS — L02416 Cutaneous abscess of left lower limb: Secondary | ICD-10-CM | POA: Diagnosis not present

## 2018-07-21 DIAGNOSIS — G8929 Other chronic pain: Secondary | ICD-10-CM | POA: Diagnosis not present

## 2018-07-23 ENCOUNTER — Other Ambulatory Visit: Payer: Self-pay

## 2018-07-23 ENCOUNTER — Encounter: Payer: Self-pay | Admitting: Cardiology

## 2018-07-23 ENCOUNTER — Telehealth (INDEPENDENT_AMBULATORY_CARE_PROVIDER_SITE_OTHER): Payer: Medicare Other | Admitting: Cardiology

## 2018-07-23 VITALS — BP 164/101 | HR 85 | Ht 65.0 in | Wt 234.0 lb

## 2018-07-23 DIAGNOSIS — E782 Mixed hyperlipidemia: Secondary | ICD-10-CM

## 2018-07-23 DIAGNOSIS — E663 Overweight: Secondary | ICD-10-CM

## 2018-07-23 DIAGNOSIS — I10 Essential (primary) hypertension: Secondary | ICD-10-CM

## 2018-07-23 DIAGNOSIS — E118 Type 2 diabetes mellitus with unspecified complications: Secondary | ICD-10-CM

## 2018-07-23 DIAGNOSIS — R55 Syncope and collapse: Secondary | ICD-10-CM

## 2018-07-23 NOTE — Progress Notes (Signed)
Virtual Visit via Video Note   This visit type was conducted due to national recommendations for restrictions regarding the COVID-19 Pandemic (e.g. social distancing) in an effort to limit this patient's exposure and mitigate transmission in our community.  Due to her co-morbid illnesses, this patient is at least at moderate risk for complications without adequate follow up.  This format is felt to be most appropriate for this patient at this time.  All issues noted in this document were discussed and addressed.  A limited physical exam was performed with this format.  Please refer to the patient's chart for her consent to telehealth for [facility].   Evaluation Performed:  Follow-up visit  Date:  07/23/2018   ID:  Sarah Maldonado, DOB 1970/07/05, MRN [id number]  Patient Location: Home Provider Location: Home  PCP:  Eloisa Northern, MD  Cardiologist:  No primary care provider on file.  Electrophysiologist:  None   Chief Complaint: Essential hypertension  History of Present Illness:    Sarah Maldonado is a 48 y.o. female with past medical history of hypertension, dyslipidemia, uncontrolled diabetes mellitus and morbid obesity..  She mentions to me that she leads a sedentary lifestyle.  No chest pain orthopnea or PND.  She was supposed to get a monitor but that never happened.  She is not very compliant with medical follow-ups.  Also about a month ago she mentions to me that she had a syncopal event and went to the hospital.  I am not sure whether she really had a syncope.  She mentions to me that her hemoglobin A1c was above the level of what can be registered on the machine and now she is taking better care of herself.  The patient does not have symptoms concerning for COVID-19 infection (fever, chills, cough, or new shortness of breath).    Past Medical History:  Diagnosis Date  . 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 Carpal tunnel syndrome 11/23/2008  . Intervertebral disc disorder of lumbar  region with myelopathy 11/23/2008   Past Surgical History:  Procedure Laterality Date  . CESAREAN SECTION    . CHOLECYSTECTOMY    . KNEE SURGERY    . UTERINE FIBROID SURGERY       Current Meds  Medication Sig  . amitriptyline (ELAVIL) 75 MG tablet Take 1 tablet by mouth daily.  . cloNIDine (CATAPRES) 0.3 MG tablet Take 1 tablet by mouth 3 (three) times daily.  . DULoxetine (CYMBALTA) 60 MG capsule Take 60 mg by mouth daily.  . hydrALAZINE (APRESOLINE) 100 MG tablet Take 1 tablet by mouth daily.  Marland Kitchen HYDROcodone-acetaminophen (NORCO) 10-325 MG tablet Take 1 tablet by mouth daily.  Marland Kitchen HYSINGLA ER 20 MG T24A Take 1 tablet by mouth daily.  . meloxicam (MOBIC) 7.5 MG tablet Take 7.5 mg by mouth daily.  . metoprolol tartrate (LOPRESSOR) 100 MG tablet Take 1 tablet by mouth 2 (two) times daily.  . montelukast (SINGULAIR) 10 MG tablet Take 10 mg by mouth at bedtime.  . pregabalin (LYRICA) 100 MG capsule Take 1 capsule by mouth 3 (three) times daily.  Marland Kitchen PROAIR HFA 108 (90 Base) MCG/ACT inhaler Inhale 2 puffs into the lungs every 4 (four) hours as needed.   . rosuvastatin (CRESTOR) 40 MG tablet Take 40 mg by mouth daily.  . Semaglutide,0.25 or 0.5MG /DOS, (OZEMPIC, 0.25 OR 0.5 MG/DOSE,) 2 MG/1.5ML SOPN Inject into the skin.  . TRESIBA FLEXTOUCH 100 UNIT/ML SOPN FlexTouch Pen INJ 65 UNITS [place] D     Allergies:  Amoxicillin; Contrast media [iodinated diagnostic agents]; and Penicillins   Social History   Tobacco Use  . Smoking status: Never Smoker  . Smokeless tobacco: Never Used  Substance Use Topics  . Alcohol use: Not on file  . Drug use: Not on file     Family Hx: The patient's family history is not on file.  ROS:   Please see the history of present illness.    As mentioned above All other systems reviewed and are negative.   Prior CV studies:   The following studies were reviewed today:  Results of stress test were discussed with her  Labs/Other Tests and Data Reviewed:     EKG:  No ECG reviewed.  Recent Labs: No results found for requested labs within last 8760 hours.   Recent Lipid Panel No results found for: CHOL, TRIG, HDL, CHOLHDL, LDLCALC, LDLDIRECT  Wt Readings from Last 3 Encounters:  07/23/18 234 lb (106.1 kg)  01/29/18 247 lb (112 kg)     Objective:    Vital Signs:  BP (!) 164/101 (BP Location: Left Arm, Patient Position: Sitting, Cuff Size: Normal)   Pulse 85   Ht 5\' 5"  (1.651 m)   Wt 234 lb (106.1 kg)   BMI 38.94 kg/m    VITAL SIGNS:  reviewed CARDIOVASCULAR:  no peripheral edema  ASSESSMENT & PLAN:    1. Essential hypertension: Stable.  She tells me that she is doing better now.  Her blood pressure is elevated today but overall she tells me it is in the range of 120s to me about it. 2. In view of history of possible syncope I told her that we would be glad to do a 14-day ZIO monitoring and she is agreeable.  My nurse will discuss this in detail with her. 3. Diet was discussed for obesity and diabetes mellitus.  These issues are managed by her primary care physician. 4. In view of a more mentioned issues I will be seeing her in follow-up appointment in a month or earlier if she has any concerns  COVID-19 Education: The signs and symptoms of COVID-19 were discussed with the patient and how to seek care for testing (follow up with PCP or arrange E-visit).  The importance of social distancing was discussed today.  Time:   Today, I have spent 16 minutes with the patient with telehealth technology discussing the above problems.     Medication Adjustments/Labs and Tests Ordered: Current medicines are reviewed at length with the patient today.  Concerns regarding medicines are outlined above.   Tests Ordered: No orders of the defined types were placed in this encounter.   Medication Changes: No orders of the defined types were placed in this encounter.   Disposition:  Follow up in 1 month(s)  Signed, Garwin Brothers, MD   07/23/2018 10:12 AM    [facility]

## 2018-07-23 NOTE — Addendum Note (Signed)
Addended by: Pamala Hurry on: 07/23/2018 10:38 AM   Modules accepted: Orders

## 2018-07-23 NOTE — Patient Instructions (Signed)
Medication Instructions:  Your physician recommends that you continue on your current medications as directed. Please refer to the Current Medication list given to you today. If you need a refill on your cardiac medications before your next appointment, please call your pharmacy.   Lab work: NONE  If you have labs (blood work) drawn today and your tests are completely normal, you will receive your results only by: . MyChart Message (if you have MyChart) OR . A paper copy in the mail If you have any lab test that is abnormal or we need to change your treatment, we will call you to review the results.  Testing/Procedures: Your physician has recommended that you wear a ZIO monitor. ZIO monitors are medical devices that record the heart's electrical activity. Doctors most often use these monitors to diagnose arrhythmias. Arrhythmias are problems with the speed or rhythm of the heartbeat. The monitor is a small, portable device. You can wear one while you do your normal daily activities. This is usually used to diagnose what is causing palpitations/syncope (passing out).You will wear this device for 14 days.     Follow-Up: At [facility], you and your health needs are our priority.  As part of our continuing mission to provide you with exceptional heart care, we have created designated Provider Care Teams.  These Care Teams include your primary Cardiologist (physician) and Advanced Practice Providers (APPs -  Physician Assistants and Nurse Practitioners) who all work together to provide you with the care you need, when you need it. You will need a follow up appointment in 1 months.     

## 2018-07-27 DIAGNOSIS — G4733 Obstructive sleep apnea (adult) (pediatric): Secondary | ICD-10-CM | POA: Diagnosis not present

## 2018-07-28 DIAGNOSIS — L03116 Cellulitis of left lower limb: Secondary | ICD-10-CM | POA: Diagnosis not present

## 2018-07-28 DIAGNOSIS — Z79891 Long term (current) use of opiate analgesic: Secondary | ICD-10-CM | POA: Diagnosis not present

## 2018-07-28 DIAGNOSIS — M1991 Primary osteoarthritis, unspecified site: Secondary | ICD-10-CM | POA: Diagnosis not present

## 2018-07-28 DIAGNOSIS — I129 Hypertensive chronic kidney disease with stage 1 through stage 4 chronic kidney disease, or unspecified chronic kidney disease: Secondary | ICD-10-CM | POA: Diagnosis not present

## 2018-07-28 DIAGNOSIS — E876 Hypokalemia: Secondary | ICD-10-CM | POA: Diagnosis not present

## 2018-07-28 DIAGNOSIS — Z9181 History of falling: Secondary | ICD-10-CM | POA: Diagnosis not present

## 2018-07-28 DIAGNOSIS — L02416 Cutaneous abscess of left lower limb: Secondary | ICD-10-CM | POA: Diagnosis not present

## 2018-07-28 DIAGNOSIS — Z79899 Other long term (current) drug therapy: Secondary | ICD-10-CM | POA: Diagnosis not present

## 2018-07-28 DIAGNOSIS — R569 Unspecified convulsions: Secondary | ICD-10-CM | POA: Diagnosis not present

## 2018-07-28 DIAGNOSIS — M5441 Lumbago with sciatica, right side: Secondary | ICD-10-CM | POA: Diagnosis not present

## 2018-07-28 DIAGNOSIS — G4733 Obstructive sleep apnea (adult) (pediatric): Secondary | ICD-10-CM | POA: Diagnosis not present

## 2018-07-28 DIAGNOSIS — E871 Hypo-osmolality and hyponatremia: Secondary | ICD-10-CM | POA: Diagnosis not present

## 2018-07-28 DIAGNOSIS — N183 Chronic kidney disease, stage 3 (moderate): Secondary | ICD-10-CM | POA: Diagnosis not present

## 2018-07-28 DIAGNOSIS — J449 Chronic obstructive pulmonary disease, unspecified: Secondary | ICD-10-CM | POA: Diagnosis not present

## 2018-07-28 DIAGNOSIS — G8929 Other chronic pain: Secondary | ICD-10-CM | POA: Diagnosis not present

## 2018-07-28 DIAGNOSIS — E1122 Type 2 diabetes mellitus with diabetic chronic kidney disease: Secondary | ICD-10-CM | POA: Diagnosis not present

## 2018-07-28 DIAGNOSIS — M5442 Lumbago with sciatica, left side: Secondary | ICD-10-CM | POA: Diagnosis not present

## 2018-07-28 DIAGNOSIS — E7800 Pure hypercholesterolemia, unspecified: Secondary | ICD-10-CM | POA: Diagnosis not present

## 2018-07-28 DIAGNOSIS — Z5181 Encounter for therapeutic drug level monitoring: Secondary | ICD-10-CM | POA: Diagnosis not present

## 2018-07-29 DIAGNOSIS — G4733 Obstructive sleep apnea (adult) (pediatric): Secondary | ICD-10-CM | POA: Diagnosis not present

## 2018-08-04 DIAGNOSIS — E7800 Pure hypercholesterolemia, unspecified: Secondary | ICD-10-CM | POA: Diagnosis not present

## 2018-08-04 DIAGNOSIS — Z79899 Other long term (current) drug therapy: Secondary | ICD-10-CM | POA: Diagnosis not present

## 2018-08-04 DIAGNOSIS — E871 Hypo-osmolality and hyponatremia: Secondary | ICD-10-CM | POA: Diagnosis not present

## 2018-08-04 DIAGNOSIS — Z9181 History of falling: Secondary | ICD-10-CM | POA: Diagnosis not present

## 2018-08-04 DIAGNOSIS — G8929 Other chronic pain: Secondary | ICD-10-CM | POA: Diagnosis not present

## 2018-08-04 DIAGNOSIS — E1122 Type 2 diabetes mellitus with diabetic chronic kidney disease: Secondary | ICD-10-CM | POA: Diagnosis not present

## 2018-08-04 DIAGNOSIS — G4733 Obstructive sleep apnea (adult) (pediatric): Secondary | ICD-10-CM | POA: Diagnosis not present

## 2018-08-04 DIAGNOSIS — J449 Chronic obstructive pulmonary disease, unspecified: Secondary | ICD-10-CM | POA: Diagnosis not present

## 2018-08-04 DIAGNOSIS — N183 Chronic kidney disease, stage 3 (moderate): Secondary | ICD-10-CM | POA: Diagnosis not present

## 2018-08-04 DIAGNOSIS — M1991 Primary osteoarthritis, unspecified site: Secondary | ICD-10-CM | POA: Diagnosis not present

## 2018-08-04 DIAGNOSIS — I129 Hypertensive chronic kidney disease with stage 1 through stage 4 chronic kidney disease, or unspecified chronic kidney disease: Secondary | ICD-10-CM | POA: Diagnosis not present

## 2018-08-04 DIAGNOSIS — R569 Unspecified convulsions: Secondary | ICD-10-CM | POA: Diagnosis not present

## 2018-08-04 DIAGNOSIS — L03116 Cellulitis of left lower limb: Secondary | ICD-10-CM | POA: Diagnosis not present

## 2018-08-04 DIAGNOSIS — L02416 Cutaneous abscess of left lower limb: Secondary | ICD-10-CM | POA: Diagnosis not present

## 2018-08-04 DIAGNOSIS — E876 Hypokalemia: Secondary | ICD-10-CM | POA: Diagnosis not present

## 2018-08-11 DIAGNOSIS — E876 Hypokalemia: Secondary | ICD-10-CM | POA: Diagnosis not present

## 2018-08-11 DIAGNOSIS — I129 Hypertensive chronic kidney disease with stage 1 through stage 4 chronic kidney disease, or unspecified chronic kidney disease: Secondary | ICD-10-CM | POA: Diagnosis not present

## 2018-08-11 DIAGNOSIS — E871 Hypo-osmolality and hyponatremia: Secondary | ICD-10-CM | POA: Diagnosis not present

## 2018-08-11 DIAGNOSIS — E7800 Pure hypercholesterolemia, unspecified: Secondary | ICD-10-CM | POA: Diagnosis not present

## 2018-08-11 DIAGNOSIS — J449 Chronic obstructive pulmonary disease, unspecified: Secondary | ICD-10-CM | POA: Diagnosis not present

## 2018-08-11 DIAGNOSIS — G8929 Other chronic pain: Secondary | ICD-10-CM | POA: Diagnosis not present

## 2018-08-11 DIAGNOSIS — G4733 Obstructive sleep apnea (adult) (pediatric): Secondary | ICD-10-CM | POA: Diagnosis not present

## 2018-08-11 DIAGNOSIS — L03116 Cellulitis of left lower limb: Secondary | ICD-10-CM | POA: Diagnosis not present

## 2018-08-11 DIAGNOSIS — L02416 Cutaneous abscess of left lower limb: Secondary | ICD-10-CM | POA: Diagnosis not present

## 2018-08-11 DIAGNOSIS — Z9181 History of falling: Secondary | ICD-10-CM | POA: Diagnosis not present

## 2018-08-11 DIAGNOSIS — N183 Chronic kidney disease, stage 3 (moderate): Secondary | ICD-10-CM | POA: Diagnosis not present

## 2018-08-11 DIAGNOSIS — Z79899 Other long term (current) drug therapy: Secondary | ICD-10-CM | POA: Diagnosis not present

## 2018-08-11 DIAGNOSIS — R569 Unspecified convulsions: Secondary | ICD-10-CM | POA: Diagnosis not present

## 2018-08-11 DIAGNOSIS — E1122 Type 2 diabetes mellitus with diabetic chronic kidney disease: Secondary | ICD-10-CM | POA: Diagnosis not present

## 2018-08-11 DIAGNOSIS — M1991 Primary osteoarthritis, unspecified site: Secondary | ICD-10-CM | POA: Diagnosis not present

## 2018-08-19 DIAGNOSIS — I129 Hypertensive chronic kidney disease with stage 1 through stage 4 chronic kidney disease, or unspecified chronic kidney disease: Secondary | ICD-10-CM | POA: Diagnosis not present

## 2018-08-19 DIAGNOSIS — G8929 Other chronic pain: Secondary | ICD-10-CM | POA: Diagnosis not present

## 2018-08-19 DIAGNOSIS — Z79899 Other long term (current) drug therapy: Secondary | ICD-10-CM | POA: Diagnosis not present

## 2018-08-19 DIAGNOSIS — R569 Unspecified convulsions: Secondary | ICD-10-CM | POA: Diagnosis not present

## 2018-08-19 DIAGNOSIS — E1122 Type 2 diabetes mellitus with diabetic chronic kidney disease: Secondary | ICD-10-CM | POA: Diagnosis not present

## 2018-08-19 DIAGNOSIS — E876 Hypokalemia: Secondary | ICD-10-CM | POA: Diagnosis not present

## 2018-08-19 DIAGNOSIS — L03116 Cellulitis of left lower limb: Secondary | ICD-10-CM | POA: Diagnosis not present

## 2018-08-19 DIAGNOSIS — M1991 Primary osteoarthritis, unspecified site: Secondary | ICD-10-CM | POA: Diagnosis not present

## 2018-08-19 DIAGNOSIS — G4733 Obstructive sleep apnea (adult) (pediatric): Secondary | ICD-10-CM | POA: Diagnosis not present

## 2018-08-19 DIAGNOSIS — E7800 Pure hypercholesterolemia, unspecified: Secondary | ICD-10-CM | POA: Diagnosis not present

## 2018-08-19 DIAGNOSIS — Z9181 History of falling: Secondary | ICD-10-CM | POA: Diagnosis not present

## 2018-08-19 DIAGNOSIS — J449 Chronic obstructive pulmonary disease, unspecified: Secondary | ICD-10-CM | POA: Diagnosis not present

## 2018-08-19 DIAGNOSIS — E871 Hypo-osmolality and hyponatremia: Secondary | ICD-10-CM | POA: Diagnosis not present

## 2018-08-19 DIAGNOSIS — N183 Chronic kidney disease, stage 3 (moderate): Secondary | ICD-10-CM | POA: Diagnosis not present

## 2018-08-19 DIAGNOSIS — L02416 Cutaneous abscess of left lower limb: Secondary | ICD-10-CM | POA: Diagnosis not present

## 2018-08-20 ENCOUNTER — Telehealth (INDEPENDENT_AMBULATORY_CARE_PROVIDER_SITE_OTHER): Payer: Medicare Other | Admitting: Cardiology

## 2018-08-20 ENCOUNTER — Encounter: Payer: Self-pay | Admitting: Cardiology

## 2018-08-20 ENCOUNTER — Other Ambulatory Visit: Payer: Self-pay

## 2018-08-20 VITALS — BP 145/73 | HR 108 | Ht 65.0 in | Wt 235.0 lb

## 2018-08-20 DIAGNOSIS — I10 Essential (primary) hypertension: Secondary | ICD-10-CM | POA: Diagnosis not present

## 2018-08-20 DIAGNOSIS — E118 Type 2 diabetes mellitus with unspecified complications: Secondary | ICD-10-CM

## 2018-08-20 DIAGNOSIS — E782 Mixed hyperlipidemia: Secondary | ICD-10-CM | POA: Diagnosis not present

## 2018-08-20 DIAGNOSIS — E119 Type 2 diabetes mellitus without complications: Secondary | ICD-10-CM

## 2018-08-20 HISTORY — DX: Morbid (severe) obesity due to excess calories: E66.01

## 2018-08-20 NOTE — Progress Notes (Signed)
Virtual Visit via Video Note   This visit type was conducted due to national recommendations for restrictions regarding the COVID-19 Pandemic (e.g. social distancing) in an effort to limit this patient's exposure and mitigate transmission in our community.  Due to her co-morbid illnesses, this patient is at least at moderate risk for complications without adequate follow up.  This format is felt to be most appropriate for this patient at this time.  All issues noted in this document were discussed and addressed.  A limited physical exam was performed with this format.  Please refer to the patient's chart for her consent to telehealth for [facility].   Date:  08/20/2018   ID:  Sarah Maldonado, DOB [birth year], MRN [id number]  Patient Location: Home Provider Location: Office  PCP:  Eloisa Northern, MD  Cardiologist:  No primary care provider on file.  Electrophysiologist:  None   Evaluation Performed:  Follow-Up Visit  Chief Complaint: Essential hypertension  History of Present Illness:    Sarah Maldonado is a 48 y.o. female with past medical history of essential hypertension, diabetes mellitus, mixed dyslipidemia and obesity.  Currently she denies any problems at this time.  She has had her 2-week monitoring and she mailed it out yesterday.  She denies any chest pain orthopnea or PND.  She has not checked her blood pressure recently but plans to step out today and tells me that she is visiting the pharmacy and will have a blood pressure check and will give [place] a call.  At the time of my evaluation, the patient is alert awake oriented and in no distress.  The patient does not have symptoms concerning for COVID-19 infection (fever, chills, cough, or new shortness of breath).    Past Medical History:  Diagnosis Date  . 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 Carpal tunnel syndrome 11/23/2008  . 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 CESAREAN SECTION    . CHOLECYSTECTOMY    . KNEE SURGERY    . UTERINE FIBROID SURGERY       Current Meds  Medication Sig  . amitriptyline (ELAVIL) 75 MG tablet Take 1 tablet by mouth daily.  Marland Kitchen BREO ELLIPTA 100-25 MCG/INH AEPB INL 1 PUFF PO QD  . cloNIDine (CATAPRES) 0.3 MG tablet Take 1 tablet by mouth 3 (three) times daily.  . DULoxetine (CYMBALTA) 60 MG capsule Take 60 mg by mouth daily.  . hydrALAZINE (APRESOLINE) 100 MG tablet Take 1 tablet by mouth daily.  Marland Kitchen HYDROcodone-acetaminophen (NORCO) 10-325 MG tablet Take 1 tablet by mouth daily.  Marland Kitchen HYSINGLA ER 20 MG T24A Take 1 tablet by mouth daily.  Marland Kitchen JANUVIA 100 MG tablet Take 1 tablet by mouth daily.  . meloxicam (MOBIC) 7.5 MG tablet Take 7.5 mg by mouth daily.  . metoprolol tartrate (LOPRESSOR) 100 MG tablet Take 1 tablet by mouth 2 (two) times daily.  . montelukast (SINGULAIR) 10 MG tablet Take 10 mg by mouth at bedtime.  . pregabalin (LYRICA) 100 MG capsule Take 1 capsule by mouth 3 (three) times daily.  Marland Kitchen PROAIR HFA 108 (90 Base) MCG/ACT inhaler Inhale 2 puffs into the lungs every 4 (four) hours as needed.   . rosuvastatin (CRESTOR) 40 MG tablet Take 40 mg by mouth daily.  . Semaglutide,0.25 or 0.5MG /DOS, (OZEMPIC, 0.25 OR 0.5 MG/DOSE,) 2 MG/1.5ML SOPN Inject into the skin.  . TRESIBA FLEXTOUCH 100 UNIT/ML SOPN FlexTouch Pen INJ 65  UNITS [place] D     Allergies:   Amoxicillin; Contrast media [iodinated diagnostic agents]; and Penicillins   Social History   Tobacco Use  . Smoking status: Never Smoker  . Smokeless tobacco: Never Used  Substance Use Topics  . Alcohol use: Not on file  . Drug use: Not on file     Family Hx: The patient's family history is not on file.  ROS:   Please see the history of present illness.    As above All other systems reviewed and are negative.   Prior CV studies:   The following studies were reviewed today:  None  Labs/Other Tests and Data Reviewed:    EKG:  No ECG  reviewed.  Recent Labs: No results found for requested labs within last 8760 hours.   Recent Lipid Panel No results found for: CHOL, TRIG, HDL, CHOLHDL, LDLCALC, LDLDIRECT  Wt Readings from Last 3 Encounters:  08/20/18 235 lb (106.6 kg)  07/23/18 234 lb (106.1 kg)  01/29/18 247 lb (112 kg)     Objective:    Vital Signs:  Ht 5\' 5"  (1.651 m)   Wt 235 lb (106.6 kg)   BMI 39.11 kg/m    VITAL SIGNS:  reviewed  ASSESSMENT & PLAN:    1. Essential hypertension: I discussed diet the patient at extensive length.  We are waiting for her to call in the afternoon about her vital signs which is pulse and blood pressure and will decide about titration of therapy as needed.  Salt issues and diet was discussed at length 2. Mixed dyslipidemia: It was discussed.  Importance of compliance stressed.  Importance of regular walking was stressed.  I also told her that this will help her lose weight and risks of obesity explained 3. Diabetes mellitus: Managed by primary care physician and diet was reemphasized. 4. At the time of this dictation patient has called back with her vital signs.  I will initiate her on chlorthalidone 25 mg daily.  She will be back in a week for a Chem-7.  Potassium supplementation in diet was encouraged.  Follow-up appointment in a 2 weeks  COVID-19 Education: The signs and symptoms of COVID-19 were discussed with the patient and how to seek care for testing (follow up with PCP or arrange E-visit).  The importance of social distancing was discussed today.  Time:   Today, I have spent 15 minutes with the patient with telehealth technology discussing the above problems.     Medication Adjustments/Labs and Tests Ordered: Current medicines are reviewed at length with the patient today.  Concerns regarding medicines are outlined above.   Tests Ordered: No orders of the defined types were placed in this encounter.   Medication Changes: No orders of the defined types were  placed in this encounter.   Disposition:  Follow up in 1 month(s)  Signed, Garwin Brothersajan R Maddux First, MD  08/20/2018 9:34 AM    [facility]

## 2018-08-25 DIAGNOSIS — E1122 Type 2 diabetes mellitus with diabetic chronic kidney disease: Secondary | ICD-10-CM | POA: Diagnosis not present

## 2018-08-25 DIAGNOSIS — E7800 Pure hypercholesterolemia, unspecified: Secondary | ICD-10-CM | POA: Diagnosis not present

## 2018-08-25 DIAGNOSIS — J449 Chronic obstructive pulmonary disease, unspecified: Secondary | ICD-10-CM | POA: Diagnosis not present

## 2018-08-25 DIAGNOSIS — E871 Hypo-osmolality and hyponatremia: Secondary | ICD-10-CM | POA: Diagnosis not present

## 2018-08-25 DIAGNOSIS — G4733 Obstructive sleep apnea (adult) (pediatric): Secondary | ICD-10-CM | POA: Diagnosis not present

## 2018-08-25 DIAGNOSIS — N183 Chronic kidney disease, stage 3 (moderate): Secondary | ICD-10-CM | POA: Diagnosis not present

## 2018-08-25 DIAGNOSIS — G8929 Other chronic pain: Secondary | ICD-10-CM | POA: Diagnosis not present

## 2018-08-25 DIAGNOSIS — L02416 Cutaneous abscess of left lower limb: Secondary | ICD-10-CM | POA: Diagnosis not present

## 2018-08-25 DIAGNOSIS — Z9181 History of falling: Secondary | ICD-10-CM | POA: Diagnosis not present

## 2018-08-25 DIAGNOSIS — M1991 Primary osteoarthritis, unspecified site: Secondary | ICD-10-CM | POA: Diagnosis not present

## 2018-08-25 DIAGNOSIS — Z79899 Other long term (current) drug therapy: Secondary | ICD-10-CM | POA: Diagnosis not present

## 2018-08-25 DIAGNOSIS — L03116 Cellulitis of left lower limb: Secondary | ICD-10-CM | POA: Diagnosis not present

## 2018-08-25 DIAGNOSIS — E876 Hypokalemia: Secondary | ICD-10-CM | POA: Diagnosis not present

## 2018-08-25 DIAGNOSIS — R569 Unspecified convulsions: Secondary | ICD-10-CM | POA: Diagnosis not present

## 2018-08-25 DIAGNOSIS — I129 Hypertensive chronic kidney disease with stage 1 through stage 4 chronic kidney disease, or unspecified chronic kidney disease: Secondary | ICD-10-CM | POA: Diagnosis not present

## 2018-08-27 DIAGNOSIS — G4733 Obstructive sleep apnea (adult) (pediatric): Secondary | ICD-10-CM | POA: Diagnosis not present

## 2018-08-31 DIAGNOSIS — Z5181 Encounter for therapeutic drug level monitoring: Secondary | ICD-10-CM | POA: Diagnosis not present

## 2018-08-31 DIAGNOSIS — I10 Essential (primary) hypertension: Secondary | ICD-10-CM | POA: Diagnosis not present

## 2018-08-31 DIAGNOSIS — E1165 Type 2 diabetes mellitus with hyperglycemia: Secondary | ICD-10-CM | POA: Diagnosis not present

## 2018-08-31 DIAGNOSIS — G894 Chronic pain syndrome: Secondary | ICD-10-CM | POA: Diagnosis not present

## 2018-09-03 DIAGNOSIS — G4733 Obstructive sleep apnea (adult) (pediatric): Secondary | ICD-10-CM | POA: Diagnosis not present

## 2018-09-03 DIAGNOSIS — J449 Chronic obstructive pulmonary disease, unspecified: Secondary | ICD-10-CM | POA: Diagnosis not present

## 2018-09-03 DIAGNOSIS — L02416 Cutaneous abscess of left lower limb: Secondary | ICD-10-CM | POA: Diagnosis not present

## 2018-09-03 DIAGNOSIS — R569 Unspecified convulsions: Secondary | ICD-10-CM | POA: Diagnosis not present

## 2018-09-03 DIAGNOSIS — Z79899 Other long term (current) drug therapy: Secondary | ICD-10-CM | POA: Diagnosis not present

## 2018-09-03 DIAGNOSIS — N183 Chronic kidney disease, stage 3 (moderate): Secondary | ICD-10-CM | POA: Diagnosis not present

## 2018-09-03 DIAGNOSIS — E1122 Type 2 diabetes mellitus with diabetic chronic kidney disease: Secondary | ICD-10-CM | POA: Diagnosis not present

## 2018-09-03 DIAGNOSIS — M1991 Primary osteoarthritis, unspecified site: Secondary | ICD-10-CM | POA: Diagnosis not present

## 2018-09-03 DIAGNOSIS — I129 Hypertensive chronic kidney disease with stage 1 through stage 4 chronic kidney disease, or unspecified chronic kidney disease: Secondary | ICD-10-CM | POA: Diagnosis not present

## 2018-09-03 DIAGNOSIS — E876 Hypokalemia: Secondary | ICD-10-CM | POA: Diagnosis not present

## 2018-09-03 DIAGNOSIS — E7800 Pure hypercholesterolemia, unspecified: Secondary | ICD-10-CM | POA: Diagnosis not present

## 2018-09-03 DIAGNOSIS — G8929 Other chronic pain: Secondary | ICD-10-CM | POA: Diagnosis not present

## 2018-09-03 DIAGNOSIS — E871 Hypo-osmolality and hyponatremia: Secondary | ICD-10-CM | POA: Diagnosis not present

## 2018-09-03 DIAGNOSIS — L03116 Cellulitis of left lower limb: Secondary | ICD-10-CM | POA: Diagnosis not present

## 2018-09-03 DIAGNOSIS — Z9181 History of falling: Secondary | ICD-10-CM | POA: Diagnosis not present

## 2018-09-08 DIAGNOSIS — L02416 Cutaneous abscess of left lower limb: Secondary | ICD-10-CM | POA: Diagnosis not present

## 2018-09-08 DIAGNOSIS — E7800 Pure hypercholesterolemia, unspecified: Secondary | ICD-10-CM | POA: Diagnosis not present

## 2018-09-08 DIAGNOSIS — G4733 Obstructive sleep apnea (adult) (pediatric): Secondary | ICD-10-CM | POA: Diagnosis not present

## 2018-09-08 DIAGNOSIS — I129 Hypertensive chronic kidney disease with stage 1 through stage 4 chronic kidney disease, or unspecified chronic kidney disease: Secondary | ICD-10-CM | POA: Diagnosis not present

## 2018-09-08 DIAGNOSIS — M1991 Primary osteoarthritis, unspecified site: Secondary | ICD-10-CM | POA: Diagnosis not present

## 2018-09-08 DIAGNOSIS — E1122 Type 2 diabetes mellitus with diabetic chronic kidney disease: Secondary | ICD-10-CM | POA: Diagnosis not present

## 2018-09-08 DIAGNOSIS — H10021 Other mucopurulent conjunctivitis, right eye: Secondary | ICD-10-CM | POA: Diagnosis not present

## 2018-09-08 DIAGNOSIS — N183 Chronic kidney disease, stage 3 (moderate): Secondary | ICD-10-CM | POA: Diagnosis not present

## 2018-09-08 DIAGNOSIS — Z9181 History of falling: Secondary | ICD-10-CM | POA: Diagnosis not present

## 2018-09-08 DIAGNOSIS — E876 Hypokalemia: Secondary | ICD-10-CM | POA: Diagnosis not present

## 2018-09-08 DIAGNOSIS — L03116 Cellulitis of left lower limb: Secondary | ICD-10-CM | POA: Diagnosis not present

## 2018-09-08 DIAGNOSIS — G8929 Other chronic pain: Secondary | ICD-10-CM | POA: Diagnosis not present

## 2018-09-08 DIAGNOSIS — E871 Hypo-osmolality and hyponatremia: Secondary | ICD-10-CM | POA: Diagnosis not present

## 2018-09-08 DIAGNOSIS — K146 Glossodynia: Secondary | ICD-10-CM | POA: Diagnosis not present

## 2018-09-08 DIAGNOSIS — R569 Unspecified convulsions: Secondary | ICD-10-CM | POA: Diagnosis not present

## 2018-09-08 DIAGNOSIS — Z79899 Other long term (current) drug therapy: Secondary | ICD-10-CM | POA: Diagnosis not present

## 2018-09-08 DIAGNOSIS — J449 Chronic obstructive pulmonary disease, unspecified: Secondary | ICD-10-CM | POA: Diagnosis not present

## 2018-09-15 DIAGNOSIS — G4733 Obstructive sleep apnea (adult) (pediatric): Secondary | ICD-10-CM | POA: Diagnosis not present

## 2018-09-15 DIAGNOSIS — L03116 Cellulitis of left lower limb: Secondary | ICD-10-CM | POA: Diagnosis not present

## 2018-09-15 DIAGNOSIS — G8929 Other chronic pain: Secondary | ICD-10-CM | POA: Diagnosis not present

## 2018-09-15 DIAGNOSIS — E7800 Pure hypercholesterolemia, unspecified: Secondary | ICD-10-CM | POA: Diagnosis not present

## 2018-09-15 DIAGNOSIS — R569 Unspecified convulsions: Secondary | ICD-10-CM | POA: Diagnosis not present

## 2018-09-15 DIAGNOSIS — E1122 Type 2 diabetes mellitus with diabetic chronic kidney disease: Secondary | ICD-10-CM | POA: Diagnosis not present

## 2018-09-15 DIAGNOSIS — I129 Hypertensive chronic kidney disease with stage 1 through stage 4 chronic kidney disease, or unspecified chronic kidney disease: Secondary | ICD-10-CM | POA: Diagnosis not present

## 2018-09-15 DIAGNOSIS — E876 Hypokalemia: Secondary | ICD-10-CM | POA: Diagnosis not present

## 2018-09-15 DIAGNOSIS — J449 Chronic obstructive pulmonary disease, unspecified: Secondary | ICD-10-CM | POA: Diagnosis not present

## 2018-09-15 DIAGNOSIS — Z79899 Other long term (current) drug therapy: Secondary | ICD-10-CM | POA: Diagnosis not present

## 2018-09-15 DIAGNOSIS — Z9181 History of falling: Secondary | ICD-10-CM | POA: Diagnosis not present

## 2018-09-15 DIAGNOSIS — L02416 Cutaneous abscess of left lower limb: Secondary | ICD-10-CM | POA: Diagnosis not present

## 2018-09-15 DIAGNOSIS — E871 Hypo-osmolality and hyponatremia: Secondary | ICD-10-CM | POA: Diagnosis not present

## 2018-09-15 DIAGNOSIS — M1991 Primary osteoarthritis, unspecified site: Secondary | ICD-10-CM | POA: Diagnosis not present

## 2018-09-15 DIAGNOSIS — N183 Chronic kidney disease, stage 3 (moderate): Secondary | ICD-10-CM | POA: Diagnosis not present

## 2018-09-22 DIAGNOSIS — Z9181 History of falling: Secondary | ICD-10-CM | POA: Diagnosis not present

## 2018-09-22 DIAGNOSIS — N183 Chronic kidney disease, stage 3 (moderate): Secondary | ICD-10-CM | POA: Diagnosis not present

## 2018-09-22 DIAGNOSIS — G4733 Obstructive sleep apnea (adult) (pediatric): Secondary | ICD-10-CM | POA: Diagnosis not present

## 2018-09-22 DIAGNOSIS — E876 Hypokalemia: Secondary | ICD-10-CM | POA: Diagnosis not present

## 2018-09-22 DIAGNOSIS — L03116 Cellulitis of left lower limb: Secondary | ICD-10-CM | POA: Diagnosis not present

## 2018-09-22 DIAGNOSIS — L02416 Cutaneous abscess of left lower limb: Secondary | ICD-10-CM | POA: Diagnosis not present

## 2018-09-22 DIAGNOSIS — I129 Hypertensive chronic kidney disease with stage 1 through stage 4 chronic kidney disease, or unspecified chronic kidney disease: Secondary | ICD-10-CM | POA: Diagnosis not present

## 2018-09-22 DIAGNOSIS — J449 Chronic obstructive pulmonary disease, unspecified: Secondary | ICD-10-CM | POA: Diagnosis not present

## 2018-09-22 DIAGNOSIS — M1991 Primary osteoarthritis, unspecified site: Secondary | ICD-10-CM | POA: Diagnosis not present

## 2018-09-22 DIAGNOSIS — E7800 Pure hypercholesterolemia, unspecified: Secondary | ICD-10-CM | POA: Diagnosis not present

## 2018-09-22 DIAGNOSIS — E871 Hypo-osmolality and hyponatremia: Secondary | ICD-10-CM | POA: Diagnosis not present

## 2018-09-22 DIAGNOSIS — E1122 Type 2 diabetes mellitus with diabetic chronic kidney disease: Secondary | ICD-10-CM | POA: Diagnosis not present

## 2018-09-22 DIAGNOSIS — Z79899 Other long term (current) drug therapy: Secondary | ICD-10-CM | POA: Diagnosis not present

## 2018-09-22 DIAGNOSIS — G8929 Other chronic pain: Secondary | ICD-10-CM | POA: Diagnosis not present

## 2018-09-22 DIAGNOSIS — R569 Unspecified convulsions: Secondary | ICD-10-CM | POA: Diagnosis not present

## 2018-09-26 DIAGNOSIS — G4733 Obstructive sleep apnea (adult) (pediatric): Secondary | ICD-10-CM | POA: Diagnosis not present

## 2018-09-28 DIAGNOSIS — Z79899 Other long term (current) drug therapy: Secondary | ICD-10-CM | POA: Diagnosis not present

## 2018-09-28 DIAGNOSIS — E1122 Type 2 diabetes mellitus with diabetic chronic kidney disease: Secondary | ICD-10-CM | POA: Diagnosis not present

## 2018-09-28 DIAGNOSIS — S79911A Unspecified injury of right hip, initial encounter: Secondary | ICD-10-CM | POA: Diagnosis not present

## 2018-09-28 DIAGNOSIS — N183 Chronic kidney disease, stage 3 (moderate): Secondary | ICD-10-CM | POA: Diagnosis not present

## 2018-09-28 DIAGNOSIS — Z794 Long term (current) use of insulin: Secondary | ICD-10-CM | POA: Diagnosis not present

## 2018-09-28 DIAGNOSIS — M25551 Pain in right hip: Secondary | ICD-10-CM | POA: Diagnosis not present

## 2018-09-28 DIAGNOSIS — J449 Chronic obstructive pulmonary disease, unspecified: Secondary | ICD-10-CM | POA: Diagnosis not present

## 2018-09-28 DIAGNOSIS — M1990 Unspecified osteoarthritis, unspecified site: Secondary | ICD-10-CM | POA: Diagnosis not present

## 2018-09-28 DIAGNOSIS — G8929 Other chronic pain: Secondary | ICD-10-CM | POA: Diagnosis not present

## 2018-09-28 DIAGNOSIS — W19XXXA Unspecified fall, initial encounter: Secondary | ICD-10-CM | POA: Diagnosis not present

## 2018-09-28 DIAGNOSIS — I129 Hypertensive chronic kidney disease with stage 1 through stage 4 chronic kidney disease, or unspecified chronic kidney disease: Secondary | ICD-10-CM | POA: Diagnosis not present

## 2018-09-28 DIAGNOSIS — Z79891 Long term (current) use of opiate analgesic: Secondary | ICD-10-CM | POA: Diagnosis not present

## 2018-09-28 DIAGNOSIS — M5489 Other dorsalgia: Secondary | ICD-10-CM | POA: Diagnosis not present

## 2018-10-01 DIAGNOSIS — Z5181 Encounter for therapeutic drug level monitoring: Secondary | ICD-10-CM | POA: Diagnosis not present

## 2018-10-01 DIAGNOSIS — Z794 Long term (current) use of insulin: Secondary | ICD-10-CM | POA: Diagnosis not present

## 2018-10-01 DIAGNOSIS — Z1389 Encounter for screening for other disorder: Secondary | ICD-10-CM | POA: Diagnosis not present

## 2018-10-01 DIAGNOSIS — E1140 Type 2 diabetes mellitus with diabetic neuropathy, unspecified: Secondary | ICD-10-CM | POA: Diagnosis not present

## 2018-10-01 DIAGNOSIS — I10 Essential (primary) hypertension: Secondary | ICD-10-CM | POA: Diagnosis not present

## 2018-10-01 DIAGNOSIS — E1165 Type 2 diabetes mellitus with hyperglycemia: Secondary | ICD-10-CM | POA: Diagnosis not present

## 2018-10-01 DIAGNOSIS — E782 Mixed hyperlipidemia: Secondary | ICD-10-CM | POA: Diagnosis not present

## 2018-10-01 DIAGNOSIS — Z0000 Encounter for general adult medical examination without abnormal findings: Secondary | ICD-10-CM | POA: Diagnosis not present

## 2018-10-06 DIAGNOSIS — Z9181 History of falling: Secondary | ICD-10-CM | POA: Diagnosis not present

## 2018-10-06 DIAGNOSIS — L02416 Cutaneous abscess of left lower limb: Secondary | ICD-10-CM | POA: Diagnosis not present

## 2018-10-06 DIAGNOSIS — M1991 Primary osteoarthritis, unspecified site: Secondary | ICD-10-CM | POA: Diagnosis not present

## 2018-10-06 DIAGNOSIS — R569 Unspecified convulsions: Secondary | ICD-10-CM | POA: Diagnosis not present

## 2018-10-06 DIAGNOSIS — N183 Chronic kidney disease, stage 3 (moderate): Secondary | ICD-10-CM | POA: Diagnosis not present

## 2018-10-06 DIAGNOSIS — J449 Chronic obstructive pulmonary disease, unspecified: Secondary | ICD-10-CM | POA: Diagnosis not present

## 2018-10-06 DIAGNOSIS — L03116 Cellulitis of left lower limb: Secondary | ICD-10-CM | POA: Diagnosis not present

## 2018-10-06 DIAGNOSIS — G8929 Other chronic pain: Secondary | ICD-10-CM | POA: Diagnosis not present

## 2018-10-06 DIAGNOSIS — Z79899 Other long term (current) drug therapy: Secondary | ICD-10-CM | POA: Diagnosis not present

## 2018-10-06 DIAGNOSIS — I129 Hypertensive chronic kidney disease with stage 1 through stage 4 chronic kidney disease, or unspecified chronic kidney disease: Secondary | ICD-10-CM | POA: Diagnosis not present

## 2018-10-06 DIAGNOSIS — E1122 Type 2 diabetes mellitus with diabetic chronic kidney disease: Secondary | ICD-10-CM | POA: Diagnosis not present

## 2018-10-06 DIAGNOSIS — G4733 Obstructive sleep apnea (adult) (pediatric): Secondary | ICD-10-CM | POA: Diagnosis not present

## 2018-10-06 DIAGNOSIS — E871 Hypo-osmolality and hyponatremia: Secondary | ICD-10-CM | POA: Diagnosis not present

## 2018-10-06 DIAGNOSIS — E876 Hypokalemia: Secondary | ICD-10-CM | POA: Diagnosis not present

## 2018-10-06 DIAGNOSIS — E7800 Pure hypercholesterolemia, unspecified: Secondary | ICD-10-CM | POA: Diagnosis not present

## 2018-10-13 DIAGNOSIS — G4733 Obstructive sleep apnea (adult) (pediatric): Secondary | ICD-10-CM | POA: Diagnosis not present

## 2018-10-13 DIAGNOSIS — M1991 Primary osteoarthritis, unspecified site: Secondary | ICD-10-CM | POA: Diagnosis not present

## 2018-10-13 DIAGNOSIS — G8929 Other chronic pain: Secondary | ICD-10-CM | POA: Diagnosis not present

## 2018-10-13 DIAGNOSIS — I129 Hypertensive chronic kidney disease with stage 1 through stage 4 chronic kidney disease, or unspecified chronic kidney disease: Secondary | ICD-10-CM | POA: Diagnosis not present

## 2018-10-13 DIAGNOSIS — R569 Unspecified convulsions: Secondary | ICD-10-CM | POA: Diagnosis not present

## 2018-10-13 DIAGNOSIS — E876 Hypokalemia: Secondary | ICD-10-CM | POA: Diagnosis not present

## 2018-10-13 DIAGNOSIS — Z79899 Other long term (current) drug therapy: Secondary | ICD-10-CM | POA: Diagnosis not present

## 2018-10-13 DIAGNOSIS — N183 Chronic kidney disease, stage 3 (moderate): Secondary | ICD-10-CM | POA: Diagnosis not present

## 2018-10-13 DIAGNOSIS — J449 Chronic obstructive pulmonary disease, unspecified: Secondary | ICD-10-CM | POA: Diagnosis not present

## 2018-10-13 DIAGNOSIS — E7800 Pure hypercholesterolemia, unspecified: Secondary | ICD-10-CM | POA: Diagnosis not present

## 2018-10-13 DIAGNOSIS — Z9181 History of falling: Secondary | ICD-10-CM | POA: Diagnosis not present

## 2018-10-13 DIAGNOSIS — L02416 Cutaneous abscess of left lower limb: Secondary | ICD-10-CM | POA: Diagnosis not present

## 2018-10-13 DIAGNOSIS — E1122 Type 2 diabetes mellitus with diabetic chronic kidney disease: Secondary | ICD-10-CM | POA: Diagnosis not present

## 2018-10-13 DIAGNOSIS — E871 Hypo-osmolality and hyponatremia: Secondary | ICD-10-CM | POA: Diagnosis not present

## 2018-10-13 DIAGNOSIS — L03116 Cellulitis of left lower limb: Secondary | ICD-10-CM | POA: Diagnosis not present

## 2018-10-16 ENCOUNTER — Telehealth: Payer: Self-pay | Admitting: Cardiology

## 2018-10-16 DIAGNOSIS — I10 Essential (primary) hypertension: Secondary | ICD-10-CM | POA: Diagnosis not present

## 2018-10-16 DIAGNOSIS — M5431 Sciatica, right side: Secondary | ICD-10-CM | POA: Diagnosis not present

## 2018-10-16 DIAGNOSIS — E1165 Type 2 diabetes mellitus with hyperglycemia: Secondary | ICD-10-CM | POA: Diagnosis not present

## 2018-10-16 DIAGNOSIS — M5489 Other dorsalgia: Secondary | ICD-10-CM | POA: Diagnosis not present

## 2018-10-16 DIAGNOSIS — M5430 Sciatica, unspecified side: Secondary | ICD-10-CM | POA: Diagnosis not present

## 2018-10-16 NOTE — Telephone Encounter (Signed)
Patient wants a note stating she can return to work on 10/21/2018.  Please call her.

## 2018-10-20 DIAGNOSIS — M1991 Primary osteoarthritis, unspecified site: Secondary | ICD-10-CM | POA: Diagnosis not present

## 2018-10-20 DIAGNOSIS — Z79899 Other long term (current) drug therapy: Secondary | ICD-10-CM | POA: Diagnosis not present

## 2018-10-20 DIAGNOSIS — E871 Hypo-osmolality and hyponatremia: Secondary | ICD-10-CM | POA: Diagnosis not present

## 2018-10-20 DIAGNOSIS — J449 Chronic obstructive pulmonary disease, unspecified: Secondary | ICD-10-CM | POA: Diagnosis not present

## 2018-10-20 DIAGNOSIS — N183 Chronic kidney disease, stage 3 (moderate): Secondary | ICD-10-CM | POA: Diagnosis not present

## 2018-10-20 DIAGNOSIS — L02416 Cutaneous abscess of left lower limb: Secondary | ICD-10-CM | POA: Diagnosis not present

## 2018-10-20 DIAGNOSIS — E7800 Pure hypercholesterolemia, unspecified: Secondary | ICD-10-CM | POA: Diagnosis not present

## 2018-10-20 DIAGNOSIS — G8929 Other chronic pain: Secondary | ICD-10-CM | POA: Diagnosis not present

## 2018-10-20 DIAGNOSIS — L03116 Cellulitis of left lower limb: Secondary | ICD-10-CM | POA: Diagnosis not present

## 2018-10-20 DIAGNOSIS — R569 Unspecified convulsions: Secondary | ICD-10-CM | POA: Diagnosis not present

## 2018-10-20 DIAGNOSIS — I129 Hypertensive chronic kidney disease with stage 1 through stage 4 chronic kidney disease, or unspecified chronic kidney disease: Secondary | ICD-10-CM | POA: Diagnosis not present

## 2018-10-20 DIAGNOSIS — Z9181 History of falling: Secondary | ICD-10-CM | POA: Diagnosis not present

## 2018-10-20 DIAGNOSIS — G4733 Obstructive sleep apnea (adult) (pediatric): Secondary | ICD-10-CM | POA: Diagnosis not present

## 2018-10-20 DIAGNOSIS — E876 Hypokalemia: Secondary | ICD-10-CM | POA: Diagnosis not present

## 2018-10-20 DIAGNOSIS — E1122 Type 2 diabetes mellitus with diabetic chronic kidney disease: Secondary | ICD-10-CM | POA: Diagnosis not present

## 2018-10-22 DIAGNOSIS — R52 Pain, unspecified: Secondary | ICD-10-CM | POA: Diagnosis not present

## 2018-10-22 DIAGNOSIS — M25512 Pain in left shoulder: Secondary | ICD-10-CM | POA: Diagnosis not present

## 2018-10-22 DIAGNOSIS — W19XXXA Unspecified fall, initial encounter: Secondary | ICD-10-CM | POA: Diagnosis not present

## 2018-10-22 DIAGNOSIS — M79652 Pain in left thigh: Secondary | ICD-10-CM | POA: Diagnosis not present

## 2018-10-22 DIAGNOSIS — R296 Repeated falls: Secondary | ICD-10-CM | POA: Diagnosis not present

## 2018-10-22 DIAGNOSIS — R000 Tachycardia, unspecified: Secondary | ICD-10-CM | POA: Diagnosis not present

## 2018-10-22 DIAGNOSIS — S79912A Unspecified injury of left hip, initial encounter: Secondary | ICD-10-CM | POA: Diagnosis not present

## 2018-10-22 DIAGNOSIS — E1165 Type 2 diabetes mellitus with hyperglycemia: Secondary | ICD-10-CM | POA: Diagnosis not present

## 2018-10-22 DIAGNOSIS — S90111A Contusion of right great toe without damage to nail, initial encounter: Secondary | ICD-10-CM | POA: Diagnosis not present

## 2018-10-22 DIAGNOSIS — M545 Low back pain: Secondary | ICD-10-CM | POA: Diagnosis not present

## 2018-10-22 DIAGNOSIS — S4992XA Unspecified injury of left shoulder and upper arm, initial encounter: Secondary | ICD-10-CM | POA: Diagnosis not present

## 2018-10-22 DIAGNOSIS — M79674 Pain in right toe(s): Secondary | ICD-10-CM | POA: Diagnosis not present

## 2018-10-22 DIAGNOSIS — R55 Syncope and collapse: Secondary | ICD-10-CM | POA: Diagnosis not present

## 2018-10-22 DIAGNOSIS — I10 Essential (primary) hypertension: Secondary | ICD-10-CM | POA: Diagnosis not present

## 2018-10-22 DIAGNOSIS — M25552 Pain in left hip: Secondary | ICD-10-CM | POA: Diagnosis not present

## 2018-10-26 DIAGNOSIS — J301 Allergic rhinitis due to pollen: Secondary | ICD-10-CM | POA: Diagnosis not present

## 2018-10-26 DIAGNOSIS — J4540 Moderate persistent asthma, uncomplicated: Secondary | ICD-10-CM | POA: Diagnosis not present

## 2018-10-26 DIAGNOSIS — R5383 Other fatigue: Secondary | ICD-10-CM | POA: Diagnosis not present

## 2018-10-26 DIAGNOSIS — G4733 Obstructive sleep apnea (adult) (pediatric): Secondary | ICD-10-CM | POA: Diagnosis not present

## 2018-10-27 DIAGNOSIS — G4733 Obstructive sleep apnea (adult) (pediatric): Secondary | ICD-10-CM | POA: Diagnosis not present

## 2018-10-29 DIAGNOSIS — I10 Essential (primary) hypertension: Secondary | ICD-10-CM | POA: Diagnosis not present

## 2018-10-29 DIAGNOSIS — Z5181 Encounter for therapeutic drug level monitoring: Secondary | ICD-10-CM | POA: Diagnosis not present

## 2018-10-29 DIAGNOSIS — E559 Vitamin D deficiency, unspecified: Secondary | ICD-10-CM | POA: Diagnosis not present

## 2018-10-29 DIAGNOSIS — G894 Chronic pain syndrome: Secondary | ICD-10-CM | POA: Diagnosis not present

## 2018-10-29 DIAGNOSIS — E782 Mixed hyperlipidemia: Secondary | ICD-10-CM | POA: Diagnosis not present

## 2018-11-02 DIAGNOSIS — G4733 Obstructive sleep apnea (adult) (pediatric): Secondary | ICD-10-CM | POA: Diagnosis not present

## 2018-11-06 DIAGNOSIS — M1711 Unilateral primary osteoarthritis, right knee: Secondary | ICD-10-CM | POA: Diagnosis not present

## 2018-11-06 DIAGNOSIS — R52 Pain, unspecified: Secondary | ICD-10-CM | POA: Diagnosis not present

## 2018-11-06 DIAGNOSIS — M79661 Pain in right lower leg: Secondary | ICD-10-CM | POA: Diagnosis not present

## 2018-11-06 DIAGNOSIS — E1165 Type 2 diabetes mellitus with hyperglycemia: Secondary | ICD-10-CM | POA: Diagnosis not present

## 2018-11-06 DIAGNOSIS — M25561 Pain in right knee: Secondary | ICD-10-CM | POA: Diagnosis not present

## 2018-11-06 DIAGNOSIS — M79604 Pain in right leg: Secondary | ICD-10-CM | POA: Diagnosis not present

## 2018-11-09 DIAGNOSIS — R5383 Other fatigue: Secondary | ICD-10-CM | POA: Diagnosis not present

## 2018-11-09 DIAGNOSIS — J301 Allergic rhinitis due to pollen: Secondary | ICD-10-CM | POA: Diagnosis not present

## 2018-11-09 DIAGNOSIS — G4733 Obstructive sleep apnea (adult) (pediatric): Secondary | ICD-10-CM | POA: Diagnosis not present

## 2018-11-09 DIAGNOSIS — J4540 Moderate persistent asthma, uncomplicated: Secondary | ICD-10-CM | POA: Diagnosis not present

## 2018-11-12 DIAGNOSIS — I10 Essential (primary) hypertension: Secondary | ICD-10-CM | POA: Diagnosis not present

## 2018-11-12 DIAGNOSIS — M545 Low back pain: Secondary | ICD-10-CM | POA: Diagnosis not present

## 2018-11-12 DIAGNOSIS — M5441 Lumbago with sciatica, right side: Secondary | ICD-10-CM | POA: Diagnosis not present

## 2018-11-12 DIAGNOSIS — G8929 Other chronic pain: Secondary | ICD-10-CM | POA: Diagnosis not present

## 2018-11-12 DIAGNOSIS — M25551 Pain in right hip: Secondary | ICD-10-CM | POA: Diagnosis not present

## 2018-11-17 ENCOUNTER — Encounter: Payer: Self-pay | Admitting: Neurology

## 2018-11-17 ENCOUNTER — Ambulatory Visit: Payer: Medicare Other | Admitting: Neurology

## 2018-11-18 ENCOUNTER — Telehealth: Payer: Self-pay

## 2018-11-18 NOTE — Telephone Encounter (Signed)
Patient no show for appt on 11/17/2018.

## 2018-11-25 DIAGNOSIS — M47816 Spondylosis without myelopathy or radiculopathy, lumbar region: Secondary | ICD-10-CM | POA: Diagnosis not present

## 2018-11-25 DIAGNOSIS — R55 Syncope and collapse: Secondary | ICD-10-CM | POA: Diagnosis not present

## 2018-11-25 DIAGNOSIS — R2681 Unsteadiness on feet: Secondary | ICD-10-CM | POA: Diagnosis not present

## 2018-11-25 DIAGNOSIS — R600 Localized edema: Secondary | ICD-10-CM | POA: Diagnosis not present

## 2018-11-25 DIAGNOSIS — M25551 Pain in right hip: Secondary | ICD-10-CM | POA: Diagnosis not present

## 2018-11-27 DIAGNOSIS — Z794 Long term (current) use of insulin: Secondary | ICD-10-CM | POA: Diagnosis not present

## 2018-11-27 DIAGNOSIS — Z5181 Encounter for therapeutic drug level monitoring: Secondary | ICD-10-CM | POA: Diagnosis not present

## 2018-11-27 DIAGNOSIS — G894 Chronic pain syndrome: Secondary | ICD-10-CM | POA: Diagnosis not present

## 2018-11-27 DIAGNOSIS — E1140 Type 2 diabetes mellitus with diabetic neuropathy, unspecified: Secondary | ICD-10-CM | POA: Diagnosis not present

## 2018-11-27 DIAGNOSIS — I10 Essential (primary) hypertension: Secondary | ICD-10-CM | POA: Diagnosis not present

## 2018-12-14 ENCOUNTER — Telehealth: Payer: Self-pay | Admitting: Neurology

## 2018-12-14 ENCOUNTER — Ambulatory Visit: Payer: Medicare Other | Admitting: Neurology

## 2018-12-14 ENCOUNTER — Telehealth: Payer: Self-pay

## 2018-12-14 DIAGNOSIS — M5416 Radiculopathy, lumbar region: Secondary | ICD-10-CM | POA: Diagnosis not present

## 2018-12-14 NOTE — Telephone Encounter (Signed)
FYI patient has no-showed x2 new patient appts in 2020.

## 2018-12-14 NOTE — Telephone Encounter (Signed)
Follow office protocol to dc if applicable

## 2018-12-14 NOTE — Telephone Encounter (Signed)
Patient no show for new pt appt. 

## 2018-12-15 ENCOUNTER — Encounter: Payer: Self-pay | Admitting: Neurology

## 2018-12-16 ENCOUNTER — Ambulatory Visit: Payer: Medicare Other | Admitting: Neurology

## 2018-12-16 DIAGNOSIS — G4733 Obstructive sleep apnea (adult) (pediatric): Secondary | ICD-10-CM | POA: Diagnosis not present

## 2018-12-18 ENCOUNTER — Other Ambulatory Visit: Payer: Self-pay | Admitting: Orthopedic Surgery

## 2018-12-18 DIAGNOSIS — M5412 Radiculopathy, cervical region: Secondary | ICD-10-CM

## 2018-12-25 DIAGNOSIS — E1140 Type 2 diabetes mellitus with diabetic neuropathy, unspecified: Secondary | ICD-10-CM | POA: Diagnosis not present

## 2018-12-25 DIAGNOSIS — Z794 Long term (current) use of insulin: Secondary | ICD-10-CM | POA: Diagnosis not present

## 2018-12-25 DIAGNOSIS — Z5181 Encounter for therapeutic drug level monitoring: Secondary | ICD-10-CM | POA: Diagnosis not present

## 2018-12-25 DIAGNOSIS — E1165 Type 2 diabetes mellitus with hyperglycemia: Secondary | ICD-10-CM | POA: Diagnosis not present

## 2018-12-25 DIAGNOSIS — G894 Chronic pain syndrome: Secondary | ICD-10-CM | POA: Diagnosis not present

## 2018-12-25 DIAGNOSIS — K219 Gastro-esophageal reflux disease without esophagitis: Secondary | ICD-10-CM | POA: Diagnosis not present

## 2019-01-09 ENCOUNTER — Other Ambulatory Visit: Payer: Medicare Other

## 2019-01-12 DIAGNOSIS — R000 Tachycardia, unspecified: Secondary | ICD-10-CM | POA: Diagnosis not present

## 2019-01-12 DIAGNOSIS — R55 Syncope and collapse: Secondary | ICD-10-CM | POA: Diagnosis not present

## 2019-01-12 DIAGNOSIS — M79671 Pain in right foot: Secondary | ICD-10-CM | POA: Diagnosis not present

## 2019-01-12 DIAGNOSIS — E1165 Type 2 diabetes mellitus with hyperglycemia: Secondary | ICD-10-CM | POA: Diagnosis not present

## 2019-01-12 DIAGNOSIS — M25571 Pain in right ankle and joints of right foot: Secondary | ICD-10-CM | POA: Diagnosis not present

## 2019-01-12 DIAGNOSIS — Z9181 History of falling: Secondary | ICD-10-CM | POA: Diagnosis not present

## 2019-01-12 DIAGNOSIS — M5489 Other dorsalgia: Secondary | ICD-10-CM | POA: Diagnosis not present

## 2019-01-12 DIAGNOSIS — R296 Repeated falls: Secondary | ICD-10-CM | POA: Diagnosis not present

## 2019-01-12 DIAGNOSIS — S93601A Unspecified sprain of right foot, initial encounter: Secondary | ICD-10-CM | POA: Diagnosis not present

## 2019-01-12 DIAGNOSIS — R531 Weakness: Secondary | ICD-10-CM | POA: Diagnosis not present

## 2019-01-14 DIAGNOSIS — E1140 Type 2 diabetes mellitus with diabetic neuropathy, unspecified: Secondary | ICD-10-CM | POA: Diagnosis not present

## 2019-01-14 DIAGNOSIS — E1165 Type 2 diabetes mellitus with hyperglycemia: Secondary | ICD-10-CM | POA: Diagnosis not present

## 2019-01-14 DIAGNOSIS — M79671 Pain in right foot: Secondary | ICD-10-CM | POA: Diagnosis not present

## 2019-01-14 DIAGNOSIS — R296 Repeated falls: Secondary | ICD-10-CM | POA: Diagnosis not present

## 2019-01-14 DIAGNOSIS — S93601D Unspecified sprain of right foot, subsequent encounter: Secondary | ICD-10-CM | POA: Diagnosis not present

## 2019-01-25 DIAGNOSIS — Z79891 Long term (current) use of opiate analgesic: Secondary | ICD-10-CM | POA: Diagnosis not present

## 2019-01-25 DIAGNOSIS — Z5181 Encounter for therapeutic drug level monitoring: Secondary | ICD-10-CM | POA: Diagnosis not present

## 2019-01-25 DIAGNOSIS — G8929 Other chronic pain: Secondary | ICD-10-CM | POA: Diagnosis not present

## 2019-01-25 DIAGNOSIS — M5441 Lumbago with sciatica, right side: Secondary | ICD-10-CM | POA: Diagnosis not present

## 2019-02-01 ENCOUNTER — Other Ambulatory Visit: Payer: Self-pay | Admitting: Podiatry

## 2019-02-01 DIAGNOSIS — M79671 Pain in right foot: Secondary | ICD-10-CM

## 2019-02-02 ENCOUNTER — Ambulatory Visit (INDEPENDENT_AMBULATORY_CARE_PROVIDER_SITE_OTHER): Payer: Medicare Other | Admitting: Podiatry

## 2019-02-02 ENCOUNTER — Ambulatory Visit (INDEPENDENT_AMBULATORY_CARE_PROVIDER_SITE_OTHER): Payer: Medicare Other

## 2019-02-02 ENCOUNTER — Encounter: Payer: Self-pay | Admitting: Podiatry

## 2019-02-02 ENCOUNTER — Other Ambulatory Visit: Payer: Self-pay

## 2019-02-02 DIAGNOSIS — M79671 Pain in right foot: Secondary | ICD-10-CM

## 2019-02-02 DIAGNOSIS — H05041 Tenonitis of right orbit: Secondary | ICD-10-CM | POA: Diagnosis not present

## 2019-02-02 DIAGNOSIS — R2681 Unsteadiness on feet: Secondary | ICD-10-CM

## 2019-02-02 DIAGNOSIS — M76829 Posterior tibial tendinitis, unspecified leg: Secondary | ICD-10-CM

## 2019-02-02 DIAGNOSIS — M25371 Other instability, right ankle: Secondary | ICD-10-CM | POA: Diagnosis not present

## 2019-02-22 ENCOUNTER — Other Ambulatory Visit: Payer: Self-pay | Admitting: Podiatry

## 2019-02-22 DIAGNOSIS — Z79899 Other long term (current) drug therapy: Secondary | ICD-10-CM | POA: Diagnosis not present

## 2019-02-22 DIAGNOSIS — H05041 Tenonitis of right orbit: Secondary | ICD-10-CM

## 2019-02-22 DIAGNOSIS — Z5181 Encounter for therapeutic drug level monitoring: Secondary | ICD-10-CM | POA: Diagnosis not present

## 2019-02-22 DIAGNOSIS — G894 Chronic pain syndrome: Secondary | ICD-10-CM | POA: Diagnosis not present

## 2019-03-03 ENCOUNTER — Telehealth: Payer: Self-pay | Admitting: Podiatry

## 2019-03-03 NOTE — Telephone Encounter (Signed)
Pt called to see if pt MRI has been set up. last seen 02/02/2019 [facility]

## 2019-03-21 NOTE — Progress Notes (Signed)
Subjective:  Patient ID: Sarah Maldonado, female    DOB: 10-11-1970,  MRN: [id number]  Chief Complaint  Patient presents with  . Foot Injury    Rt dorsal foot pain/injury -pt states she fell 3 times in 1 day and twisted her foot x 3 wks; 10/10 shapr pains -w/ swelling/rednes s-wrose with pressure on it -have had several falls before -pt states she went to ER was taken XRs and was dx with a torn tendon Tx: sx shoe, elevation and ace wrap   . Diabetes    FBS: 410 A1C: 15.5 PCP: Annice Pih x Nov 2     48 y.o. female presents with the above complaint. Hx above confirmed with patient.  Review of Systems: Negative except as noted in the HPI. Denies N/V/F/Ch.  Past Medical History:  Diagnosis Date  . 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 Carpal tunnel syndrome 11/23/2008  . Intervertebral disc disorder of lumbar region with myelopathy 11/23/2008    Current Outpatient Medications:  .  amitriptyline (ELAVIL) 75 MG tablet, Take 1 tablet by mouth daily., Disp: , Rfl: 2 .  amLODipine (NORVASC) 10 MG tablet, TK 1 T PO D, Disp: , Rfl:  .  BREO ELLIPTA 100-25 MCG/INH AEPB, INL 1 PUFF PO QD, Disp: , Rfl:  .  cloNIDine (CATAPRES) 0.3 MG tablet, Take 1 tablet by mouth 3 (three) times daily., Disp: , Rfl: 2 .  DULoxetine (CYMBALTA) 60 MG capsule, Take 60 mg by mouth daily., Disp: , Rfl:  .  esomeprazole (NEXIUM) 40 MG capsule, , Disp: , Rfl:  .  FARXIGA 10 MG TABS tablet, , Disp: , Rfl:  .  fenofibrate (TRICOR) 145 MG tablet, , Disp: , Rfl:  .  glimepiride (AMARYL) 4 MG tablet, TK 1 T PO ONCE D, Disp: , Rfl:  .  hydrALAZINE (APRESOLINE) 100 MG tablet, Take 1 tablet by mouth daily., Disp: , Rfl:  .  HYDROcodone-acetaminophen (NORCO) 10-325 MG tablet, Take 1 tablet by mouth daily., Disp: , Rfl:  .  HYSINGLA ER 20 MG T24A, Take 1 tablet by mouth daily., Disp: , Rfl:  .  JANUVIA 100 MG tablet, Take 1 tablet by mouth daily., Disp: , Rfl: 0 .  meloxicam (MOBIC) 7.5 MG tablet, Take 7.5 mg by mouth daily., Disp: , Rfl:    .  metoprolol tartrate (LOPRESSOR) 100 MG tablet, Take 1 tablet by mouth 2 (two) times daily., Disp: , Rfl:  .  montelukast (SINGULAIR) 10 MG tablet, Take 10 mg by mouth at bedtime., Disp: , Rfl:  .  pregabalin (LYRICA) 100 MG capsule, Take 1 capsule by mouth 3 (three) times daily., Disp: , Rfl: 0 .  PROAIR HFA 108 (90 Base) MCG/ACT inhaler, Inhale 2 puffs into the lungs every 4 (four) hours as needed. , Disp: , Rfl:  .  rosuvastatin (CRESTOR) 40 MG tablet, Take 40 mg by mouth daily., Disp: , Rfl:  .  Semaglutide,0.25 or 0.5MG /DOS, (OZEMPIC, 0.25 OR 0.5 MG/DOSE,) 2 MG/1.5ML SOPN, Inject into the skin., Disp: , Rfl:  .  TRESIBA FLEXTOUCH 100 UNIT/ML SOPN FlexTouch Pen, INJ 65 UNITS  D, Disp: , Rfl: 3  Social History   Tobacco Use  Smoking Status Never Smoker  Smokeless Tobacco Never Used    Allergies  Allergen Reactions  . Amoxicillin Hives  . Contrast Media [Iodinated Diagnostic Agents] Nausea And Vomiting  . Penicillins Hives   Objective:  There were no vitals filed for this visit. There is no height or weight on file to calculate  BMI. Constitutional Well developed. Well nourished.  Vascular Dorsalis pedis pulses palpable bilaterally. Posterior tibial pulses palpable bilaterally. Capillary refill normal to all digits.  No cyanosis or clubbing noted. Pedal hair growth normal.  Neurologic Normal speech. Oriented to person, place, and time. Epicritic sensation to light touch grossly present bilaterally.  Dermatologic Nails well groomed and normal in appearance. No open wounds. No skin lesions.  Orthopedic: POP ATFL Right, POP dorsal foot. Pain at PT tendon and at the navicular bone   Radiographs: Taken and reviewed no acute fracture.  Assessment:   1. Right ankle instability   2. PTTD (posterior tibial tendon dysfunction)   3. Gait instability    Plan:  Patient was evaluated and treated and all questions answered.  Pes planus, PTTD, forefoot abduction, ankle  instability, concern for PT tendon tear -Dispense CAM boot. -Order MRI  -Concern for possible PT tendon tear, and chronic ankle instability. R/o PT tendon tear. -F/u after MRI.  No follow-ups on file.

## 2019-03-23 NOTE — Telephone Encounter (Signed)
Pt calling back to follow up on message/call from 03/03/19. Pt has not heard anything in regards to being scheduled for an MRI and is calling to follow up. Pt was seen in office on 02/02/19.

## 2019-03-24 DIAGNOSIS — G4733 Obstructive sleep apnea (adult) (pediatric): Secondary | ICD-10-CM | POA: Diagnosis not present

## 2019-03-29 DIAGNOSIS — E1165 Type 2 diabetes mellitus with hyperglycemia: Secondary | ICD-10-CM | POA: Diagnosis not present

## 2019-03-29 DIAGNOSIS — R000 Tachycardia, unspecified: Secondary | ICD-10-CM | POA: Diagnosis not present

## 2019-03-29 DIAGNOSIS — R0689 Other abnormalities of breathing: Secondary | ICD-10-CM | POA: Diagnosis not present

## 2019-03-29 DIAGNOSIS — M25551 Pain in right hip: Secondary | ICD-10-CM | POA: Diagnosis not present

## 2019-03-29 DIAGNOSIS — M79604 Pain in right leg: Secondary | ICD-10-CM | POA: Diagnosis not present

## 2019-03-29 DIAGNOSIS — M79651 Pain in right thigh: Secondary | ICD-10-CM | POA: Diagnosis not present

## 2019-03-29 DIAGNOSIS — R52 Pain, unspecified: Secondary | ICD-10-CM | POA: Diagnosis not present

## 2019-03-29 DIAGNOSIS — E119 Type 2 diabetes mellitus without complications: Secondary | ICD-10-CM | POA: Diagnosis not present

## 2019-03-29 DIAGNOSIS — Z743 Need for continuous supervision: Secondary | ICD-10-CM | POA: Diagnosis not present

## 2019-03-30 ENCOUNTER — Telehealth: Payer: Self-pay | Admitting: *Deleted

## 2019-03-30 ENCOUNTER — Telehealth: Payer: Self-pay

## 2019-03-30 ENCOUNTER — Other Ambulatory Visit: Payer: Self-pay

## 2019-03-30 DIAGNOSIS — S96911A Strain of unspecified muscle and tendon at ankle and foot level, right foot, initial encounter: Secondary | ICD-10-CM

## 2019-03-30 DIAGNOSIS — M25371 Other instability, right ankle: Secondary | ICD-10-CM

## 2019-03-30 NOTE — Telephone Encounter (Signed)
Sarah Maldonado - [facility] states the codes given by Ellwood Handler at [place] office are declined. I gave I26.415A.

## 2019-03-30 NOTE — Telephone Encounter (Signed)
NO pre-authorization required for Rt ankle MRI, Appointment was set up at [facility] center for 03-31-18 at  7:15 AM. Orders were faxed to the MRI center with insurance information

## 2019-03-30 NOTE — Telephone Encounter (Signed)
Faxed new order form with new code (A91.916O) to [facility]

## 2019-03-30 NOTE — Telephone Encounter (Signed)
Pt called requesting to know about her MRI order status.

## 2019-03-30 NOTE — Telephone Encounter (Signed)
Notified Pt of her set up appointment at the [facility] MRI center for 04-01-19 at 7:15 AM, Pt stated understanding.

## 2019-04-01 DIAGNOSIS — M25371 Other instability, right ankle: Secondary | ICD-10-CM | POA: Diagnosis not present

## 2019-04-01 DIAGNOSIS — S92321A Displaced fracture of second metatarsal bone, right foot, initial encounter for closed fracture: Secondary | ICD-10-CM | POA: Diagnosis not present

## 2019-04-12 ENCOUNTER — Other Ambulatory Visit: Payer: Self-pay

## 2019-04-12 ENCOUNTER — Ambulatory Visit (INDEPENDENT_AMBULATORY_CARE_PROVIDER_SITE_OTHER): Payer: Medicare Other | Admitting: Podiatry

## 2019-04-12 ENCOUNTER — Ambulatory Visit (INDEPENDENT_AMBULATORY_CARE_PROVIDER_SITE_OTHER): Payer: Medicare Other

## 2019-04-12 DIAGNOSIS — S93621A Sprain of tarsometatarsal ligament of right foot, initial encounter: Secondary | ICD-10-CM

## 2019-04-12 DIAGNOSIS — S92321A Displaced fracture of second metatarsal bone, right foot, initial encounter for closed fracture: Secondary | ICD-10-CM | POA: Diagnosis not present

## 2019-04-12 DIAGNOSIS — E118 Type 2 diabetes mellitus with unspecified complications: Secondary | ICD-10-CM | POA: Diagnosis not present

## 2019-04-12 DIAGNOSIS — R2681 Unsteadiness on feet: Secondary | ICD-10-CM

## 2019-04-12 NOTE — Progress Notes (Signed)
  Subjective:  Patient ID: Sarah Maldonado, female    DOB: [birth year],  MRN: [id number]  Chief Complaint  Patient presents with  . Results    Review MRI results   . Ankle Pain    F/U Rt foot/ankel pain Pt. states," pain is about the same, no change (8/10 shapr constatn pains at top of foot)." tx: oTC topical rubbing ointments, epsom satl soaking and cam boot -w/ swelling -worse in AM     49 y.o. female presents with the above complaint. History confirmed with patient.   Objective:  Physical Exam: warm, good capillary refill, no trophic changes or ulcerative lesions, normal DP and PT pulses and normal sensory exam. Right Foot: soft tissue swelling noted over the midfoot, pain at the 2nd metatarsal base. No pain at hindfoot PT or peroneals.  No images are attached to the encounter.  Radiographs: X-ray of the right foot: fracture of 2nd metatarsal base   MRI reviewed confirms 2nd metatarsal fracture, lisfranc injury. No hindfoot injury.  Assessment:   1. Closed displaced fracture of second metatarsal bone of right foot, initial encounter   2. Gait instability   3. Lisfranc's sprain, right, initial encounter   4. Type 2 diabetes mellitus with unspecified complications (HCC)   5. Morbid obesity (HCC)    Plan:  Patient was evaluated and treated and all questions answered.  Right Lisfranc Fracture -XR Reviewed with patient -Would benefit from operative management for this injury -WBAT in CAM Boot  -Her ankle pain is improved but with worsening foot pain. MRI suggests lisfranc fracture, also seen on XR today. -Patient has failed all conservative therapy and wishes to proceed with surgical intervention. All risks, benefits, and alternatives discussed with patient. No guarantees given. Consent reviewed and signed by patient. -Planned procedures: right 2nd metatarsocuneiform fusion, other tarsometatarsal fusion as indicated. Likely reinforce the Lisfranc ligament with screw. The 1st TMT  looks without fracture or displacement.   Return for post-op care.

## 2019-04-12 NOTE — Patient Instructions (Signed)
Pre-Operative Instructions  Congratulations, you have decided to take an important step towards improving your quality of life.  You can be assured that the doctors and staff at [facility] will be with you every step of the way.  Here are some important things you should know:  1. Plan to be at the surgery center/hospital at least 1 (one) hour prior to your scheduled time, unless otherwise directed by the surgical center/hospital staff.  You must have a responsible adult accompany you, remain during the surgery and drive you home.  Make sure you have directions to the surgical center/hospital to ensure you arrive on time. 2. If you are having surgery at [facility] or [facility] hospitals, you will need a copy of your medical history and physical form from your family physician within one month prior to the date of surgery. We will give you a form for your primary physician to complete.  3. We make every effort to accommodate the date you request for surgery.  However, there are times where surgery dates or times have to be moved.  We will contact you as soon as possible if a change in schedule is required.   4. No aspirin/ibuprofen for one week before surgery.  If you are on aspirin, any non-steroidal anti-inflammatory medications (Mobic, Aleve, Ibuprofen) should not be taken seven (7) days prior to your surgery.  You make take Tylenol for pain prior to surgery.  5. Medications - If you are taking daily heart and blood pressure medications, seizure, reflux, allergy, asthma, anxiety, pain or diabetes medications, make sure you notify the surgery center/hospital before the day of surgery so they can tell you which medications you should take or avoid the day of surgery. 6. No food or drink after midnight the night before surgery unless directed otherwise by surgical center/hospital staff. 7. No alcoholic beverages 24-hours prior to surgery.  No smoking 24-hours prior or 24-hours after  surgery. 8. Wear loose pants or shorts. They should be loose enough to fit over bandages, boots, and casts. 9. Don't wear slip-on shoes. Sneakers are preferred. 10. Bring your boot with you to the surgery center/hospital.  Also bring crutches or a walker if your physician has prescribed it for you.  If you do not have this equipment, it will be provided for you after surgery. 11. If you have not been contacted by the surgery center/hospital by the day before your surgery, call to confirm the date and time of your surgery. 12. Leave-time from work may vary depending on the type of surgery you have.  Appropriate arrangements should be made prior to surgery with your employer. 13. Prescriptions will be provided immediately following surgery by your doctor.  Fill these as soon as possible after surgery and take the medication as directed. Pain medications will not be refilled on weekends and must be approved by the doctor. 14. Remove nail polish on the operative foot and avoid getting pedicures prior to surgery. 15. Wash the night before surgery.  The night before surgery wash the foot and leg well with water and the antibacterial soap provided. Be sure to pay special attention to beneath the toenails and in between the toes.  Wash for at least three (3) minutes. Rinse thoroughly with water and dry well with a towel.  Perform this wash unless told not to do so by your physician.  Enclosed: 1 Ice pack (please put in freezer the night before surgery)   1 Hibiclens skin cleaner     Pre-op instructions  If you have any questions regarding the instructions, please do not hesitate to call our office.  [place]: [address], [place], [place] [address] -- [phone]  [place]: [address] , [place] [address] -- [phone]  [place]: [address], [place], [place] [address] -- [phone]   Website: [email or web address] 

## 2019-04-13 ENCOUNTER — Telehealth: Payer: Self-pay | Admitting: *Deleted

## 2019-04-13 NOTE — Telephone Encounter (Signed)
"  I'm calling to schedule my surgery with Dr. Samuella Cota."

## 2019-04-15 ENCOUNTER — Other Ambulatory Visit: Payer: Self-pay | Admitting: Podiatry

## 2019-04-15 DIAGNOSIS — S92321A Displaced fracture of second metatarsal bone, right foot, initial encounter for closed fracture: Secondary | ICD-10-CM

## 2019-04-16 NOTE — Telephone Encounter (Signed)
"  This is my second attempt to get in contact with you to schedule my surgery with Dr. Samuella Cota.  I'm calling to schedule an appointment for my surgery."

## 2019-04-19 DIAGNOSIS — E1165 Type 2 diabetes mellitus with hyperglycemia: Secondary | ICD-10-CM | POA: Diagnosis not present

## 2019-04-19 DIAGNOSIS — Z5181 Encounter for therapeutic drug level monitoring: Secondary | ICD-10-CM | POA: Diagnosis not present

## 2019-04-19 DIAGNOSIS — G894 Chronic pain syndrome: Secondary | ICD-10-CM | POA: Diagnosis not present

## 2019-04-19 DIAGNOSIS — I10 Essential (primary) hypertension: Secondary | ICD-10-CM | POA: Diagnosis not present

## 2019-04-19 DIAGNOSIS — E1140 Type 2 diabetes mellitus with diabetic neuropathy, unspecified: Secondary | ICD-10-CM | POA: Diagnosis not present

## 2019-04-19 NOTE — Telephone Encounter (Signed)
"  I am trying to get in contact with you.  This is my third attempt to get in contact with you due to my surgery scheduling.  I'd like for you to give me a call back at your earliest convenience that is possible."  I am returning your call.  I apologize for the delay in calling you back.  I was in training sessions on Wednesday, Thursday, and Friday of last week.  You said you want to schedule your surgery with Dr. Samuella Cota.  He does surgeries on Wednesdays.  Do you have a date that you like?  "No, whatever his next available is."  Dr. Samuella Cota can do your surgery on May 05, 2019.  "That date is fine."  You need to register via the surgical center's online portal if you have access to a computer.  "I do."  The instructions are in the brochure that we gave you.  It's on a dark blue page and it says [employer].  "Okay, thank you so much."

## 2019-04-19 NOTE — Telephone Encounter (Signed)
I attempted to call the patient to schedule her surgery.  I left her a message apologizing for the late return of her call.  I explained that I have been in training meetings and I've been away from my desk.

## 2019-04-21 ENCOUNTER — Encounter: Payer: Self-pay | Admitting: Podiatry

## 2019-04-23 ENCOUNTER — Telehealth: Payer: Self-pay | Admitting: Podiatry

## 2019-04-23 NOTE — Telephone Encounter (Addendum)
DOS: 05/05/2019  SURGICAL PROCEDURES: Repair of Aniceto Boss 2nd Metatarsal Right and Fixture of Adjacent Metatarsals [phone] or [phone] VS [phone]).  [employer] Medicare Effective: 03/26/2019 -  Deductible is $0. Out of Pocket is $7,550 with $0 met and $7,550 remaining.  Notification or Prior Authorization is not required for the requested services  This [employer] Medicare Advantage members plan does not currently require a prior authorization for these services. If you have general questions about the prior authorization requirements, please call us at [phone] or visit [email or web address] > Clinician Resources > Advance and Admission Notification Requirements. The number above acknowledges your notification. Please write this number down for future reference. Notification is not a guarantee of coverage or payment.  Decision ID #:[id number]  The number above acknowledges your inquiry and our response. Please write this number down and refer to it for future inquiries. Coverage and payment for an item or service is governed by the member's benefit plan document, and, if applicable, the provider's participation agreement with the Health Plan.

## 2019-04-26 ENCOUNTER — Other Ambulatory Visit: Payer: Self-pay | Admitting: *Deleted

## 2019-04-26 ENCOUNTER — Telehealth: Payer: Self-pay | Admitting: *Deleted

## 2019-04-26 DIAGNOSIS — S93621A Sprain of tarsometatarsal ligament of right foot, initial encounter: Secondary | ICD-10-CM

## 2019-04-26 DIAGNOSIS — S92321A Displaced fracture of second metatarsal bone, right foot, initial encounter for closed fracture: Secondary | ICD-10-CM

## 2019-04-26 NOTE — Progress Notes (Signed)
Per Dr. Samuella Cota, I placed an order for a knee scooter for Ms. Sarah Maldonado.  She is scheduled to have surgery on 04/04/2019.

## 2019-04-26 NOTE — Telephone Encounter (Signed)
I am calling you in regards to your knee scooter that is needed for your upcoming surgery on 05/05/2019.  I wanted to let you know that you insurance may not cover it.  However, you can rent it or possibly buy one.  "I already have one."  Great, make sure you take it with you when you go for your surgery.  "I will."

## 2019-05-05 ENCOUNTER — Encounter: Payer: Self-pay | Admitting: Podiatry

## 2019-05-11 ENCOUNTER — Encounter: Payer: Medicare Other | Admitting: Podiatry

## 2019-05-13 ENCOUNTER — Encounter: Payer: Medicare Other | Admitting: Sports Medicine

## 2019-05-14 ENCOUNTER — Encounter: Payer: Self-pay | Admitting: Sports Medicine

## 2019-05-14 ENCOUNTER — Ambulatory Visit (INDEPENDENT_AMBULATORY_CARE_PROVIDER_SITE_OTHER): Payer: Medicare Other

## 2019-05-14 ENCOUNTER — Ambulatory Visit (INDEPENDENT_AMBULATORY_CARE_PROVIDER_SITE_OTHER): Payer: Medicare Other | Admitting: Sports Medicine

## 2019-05-14 ENCOUNTER — Other Ambulatory Visit: Payer: Self-pay

## 2019-05-14 ENCOUNTER — Other Ambulatory Visit: Payer: Self-pay | Admitting: Sports Medicine

## 2019-05-14 DIAGNOSIS — S92321D Displaced fracture of second metatarsal bone, right foot, subsequent encounter for fracture with routine healing: Secondary | ICD-10-CM

## 2019-05-14 DIAGNOSIS — E118 Type 2 diabetes mellitus with unspecified complications: Secondary | ICD-10-CM | POA: Diagnosis not present

## 2019-05-14 DIAGNOSIS — M79671 Pain in right foot: Secondary | ICD-10-CM | POA: Diagnosis not present

## 2019-05-14 DIAGNOSIS — S93621D Sprain of tarsometatarsal ligament of right foot, subsequent encounter: Secondary | ICD-10-CM

## 2019-05-14 NOTE — Progress Notes (Signed)
Subjective: Sarah Maldonado is a 49 y.o. female patient who presents to office for evaluation of rgith foot pain. Patient complains of continued pain in right foot at the top with a constant throb and swelling. Reports that her surgery with Dr. March Rummage was cancelled due to elevated blood sugar, A1c 15.5. Reports a history of high blood sugars 400-500 average. No other pedal complaints.   Patient Active Problem List   Diagnosis Date Noted  . Morbid obesity ([place]) 08/20/2018  . Postoperative examination 07/09/2018  . Abscess of left thigh 07/09/2018  . Syncope 01/29/2018  . Essential hypertension 01/29/2018  . Mixed dyslipidemia 01/29/2018  . Type 2 diabetes mellitus with unspecified complications ([place]) 09/22/1599  . Overweight 01/29/2018  . 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 Carpal tunnel syndrome 11/23/2008  . 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 amitriptyline (ELAVIL) 75 MG tablet Take 1 tablet by mouth daily.  2  . amLODipine (NORVASC) 10 MG tablet TK 1 T PO D    . BREO ELLIPTA 100-25 MCG/INH AEPB INL 1 PUFF PO QD    . cloNIDine (CATAPRES) 0.3 MG tablet Take 1 tablet by mouth 3 (three) times daily.  2  . DULoxetine (CYMBALTA) 60 MG capsule Take 60 mg by mouth daily.    Marland Kitchen esomeprazole (NEXIUM) 40 MG capsule     . FARXIGA 10 MG TABS tablet     . fenofibrate (TRICOR) 145 MG tablet     . glimepiride (AMARYL) 4 MG tablet TK 1 T PO ONCE D    . hydrALAZINE (APRESOLINE) 100 MG tablet Take 1 tablet by mouth daily.    . hydrochlorothiazide (HYDRODIURIL) 12.5 MG tablet Take 12.5 mg by mouth daily.    Marland Kitchen HYDROcodone-acetaminophen (NORCO) 10-325 MG tablet Take 1 tablet by mouth daily.    . hydrOXYzine (VISTARIL) 25 MG capsule Take 25 mg by mouth 3 (three) times daily.    Marland Kitchen HYSINGLA ER 20 MG T24A Take 1 tablet by mouth daily.    Marland Kitchen JANUVIA 100 MG tablet Take 1 tablet by mouth  daily.  0  . meloxicam (MOBIC) 7.5 MG tablet Take 7.5 mg by mouth daily.    . metoprolol tartrate (LOPRESSOR) 100 MG tablet Take 1 tablet by mouth 2 (two) times daily.    . montelukast (SINGULAIR) 10 MG tablet Take 10 mg by mouth at bedtime.    . pantoprazole (PROTONIX) 40 MG tablet Take 40 mg by mouth daily.    . pregabalin (LYRICA) 100 MG capsule Take 1 capsule by mouth 3 (three) times daily.  0  . PROAIR HFA 108 (90 Base) MCG/ACT inhaler Inhale 2 puffs into the lungs every 4 (four) hours as needed.     . rosuvastatin (CRESTOR) 40 MG tablet Take 40 mg by mouth daily.    . Semaglutide,0.25 or 0.5MG/DOS, (OZEMPIC, 0.25 OR 0.5 MG/DOSE,) 2 MG/1.5ML SOPN Inject into the skin.    . TRESIBA FLEXTOUCH 100 UNIT/ML SOPN FlexTouch Pen INJ 65 UNITS Angelina D  3   No current facility-administered medications on file prior to visit.    Allergies  Allergen Reactions  . Amoxicillin Hives  . Contrast Media [Iodinated Diagnostic Agents] Nausea And Vomiting  . Penicillins Hives    Objective:  General: Alert and oriented x3 in no acute distress  Dermatology: No open lesions bilateral lower extremities, no webspace macerations, no ecchymosis bilateral, all nails x  10 are short and well manicured.   Vascular: Dorsalis Pedis and Posterior Tibial pedal pulses palpable, Temperature gradient within normal limits.  Neurology: Johney Maine sensation intact via light touch bilateral.   Musculoskeletal: Mild tenderness with palpation at right midfoot at 1-2 met bases with focal swelling. Planus foot type.    Gait: Antalgic gait  Xrays  Right Foot   Impression:Fracture at base of 2nd met and cunieform, midtarsal breach supportive of pes planus.   Assessment and Plan: Problem List Items Addressed This Visit      Endocrine   Type 2 diabetes mellitus with unspecified complications ([place])    Other Visit Diagnoses    Closed displaced fracture of second metatarsal bone of right foot with routine healing, subsequent  encounter    -  Primary   Sprain of tarsometatarsal joint of right foot, subsequent encounter       Right foot pain           -Complete examination performed -Xrays reviewed -Discussed continued care for fracture -Dispensed surgitube compression sleeve to wear as instructed for edema control -Continue with CAM Boot and to limit activity to prevent worsening of fracture -Advised patient to follow up with her PCP and possibly may need to see an endocrinologist to help with better glycemic control and to help with lower A1c in preparation for surgery -Advised patient to further discuss with Dr. March Rummage A1C goal for surgery -Patient to return to office for follow up evaluation with Dr. March Rummage in 2-3 weeks or sooner if condition worsens.  Landis Martins, DPM

## 2019-05-17 ENCOUNTER — Encounter: Payer: Medicare Other | Admitting: Podiatry

## 2019-05-17 DIAGNOSIS — Z5181 Encounter for therapeutic drug level monitoring: Secondary | ICD-10-CM | POA: Diagnosis not present

## 2019-05-17 DIAGNOSIS — Z79899 Other long term (current) drug therapy: Secondary | ICD-10-CM | POA: Diagnosis not present

## 2019-05-17 DIAGNOSIS — G894 Chronic pain syndrome: Secondary | ICD-10-CM | POA: Diagnosis not present

## 2019-05-24 ENCOUNTER — Other Ambulatory Visit: Payer: Self-pay | Admitting: Sports Medicine

## 2019-05-24 DIAGNOSIS — S92321D Displaced fracture of second metatarsal bone, right foot, subsequent encounter for fracture with routine healing: Secondary | ICD-10-CM

## 2019-05-24 DIAGNOSIS — R198 Other specified symptoms and signs involving the digestive system and abdomen: Secondary | ICD-10-CM | POA: Diagnosis not present

## 2019-05-24 DIAGNOSIS — R319 Hematuria, unspecified: Secondary | ICD-10-CM | POA: Diagnosis not present

## 2019-05-31 ENCOUNTER — Other Ambulatory Visit: Payer: Self-pay

## 2019-05-31 ENCOUNTER — Ambulatory Visit (INDEPENDENT_AMBULATORY_CARE_PROVIDER_SITE_OTHER): Payer: Medicare Other

## 2019-05-31 ENCOUNTER — Ambulatory Visit (INDEPENDENT_AMBULATORY_CARE_PROVIDER_SITE_OTHER): Payer: Medicare Other | Admitting: Podiatry

## 2019-05-31 DIAGNOSIS — S92321D Displaced fracture of second metatarsal bone, right foot, subsequent encounter for fracture with routine healing: Secondary | ICD-10-CM

## 2019-05-31 DIAGNOSIS — S93621D Sprain of tarsometatarsal ligament of right foot, subsequent encounter: Secondary | ICD-10-CM | POA: Diagnosis not present

## 2019-05-31 DIAGNOSIS — L601 Onycholysis: Secondary | ICD-10-CM

## 2019-05-31 NOTE — Patient Instructions (Signed)

## 2019-05-31 NOTE — Progress Notes (Signed)
  Subjective:  Patient ID: Sarah Maldonado, female    DOB: September 18, 1970,  MRN: [id number]  Chief Complaint  Patient presents with  . Foot Injury    F/U Rt fx Pt. states," pain is about the smae, no improvemnet; 9/10 sharp constatn pain." -w/ less swellgin tx: surgrip and cam boot  . Diabetes    FBS: 326 A1C: 15.5 x 3 mo   49 y.o. female presents with the above complaint. History confirmed with patient.   Objective:  Physical Exam: warm, good capillary refill, no trophic changes or ulcerative lesions, normal DP and PT pulses and normal sensory exam. Right Foot: slight pain at the 2nd metatarsal base. No pain at hindfoot PT or peroneals. Right hallux nail lysis with pain to palpation, slight ss drainage noted.  No images are attached to the encounter.  Radiographs: X-ray of the right foot: fracture of 2nd metatarsal base appears consolidating. Likely consolidating fracture 3rd metatarsal.  Assessment:   1. Closed displaced fracture of second metatarsal bone of right foot with routine healing, subsequent encounter   2. Sprain of tarsometatarsal joint of right foot, subsequent encounter    Plan:  Patient was evaluated and treated and all questions answered.  Right Lisfranc Fracture -XR reviewed. -Improving. Continue CAM boot immobilization. -If pain persists could consider intervention at a later date when sugars are well controlled.  Nail Lysis Right Hallux Nail -Nail avulsed as below  Procedure: Avulsion of toenail Location: Right 1st toe  Anesthesia: Lidocaine 1% plain; 1.5 mL and Marcaine 0.5% plain; 1.5 mL, digital block. Skin Prep: Betadine. Dressing: Silvadene; telfa; dry, sterile, compression dressing. Technique: Following skin prep, the toe was exsanguinated and a tourniquet was secured at the base of the toe. The nail was freed and avulsed with a hemostat. The area was cleansed. The tourniquet was then removed and sterile dressing applied. Disposition: Patient tolerated  procedure well.      No follow-ups on file.

## 2019-06-03 DIAGNOSIS — E860 Dehydration: Secondary | ICD-10-CM | POA: Diagnosis not present

## 2019-06-03 DIAGNOSIS — N201 Calculus of ureter: Secondary | ICD-10-CM | POA: Diagnosis not present

## 2019-06-03 DIAGNOSIS — E1122 Type 2 diabetes mellitus with diabetic chronic kidney disease: Secondary | ICD-10-CM | POA: Diagnosis not present

## 2019-06-03 DIAGNOSIS — J45909 Unspecified asthma, uncomplicated: Secondary | ICD-10-CM | POA: Diagnosis not present

## 2019-06-03 DIAGNOSIS — R1031 Right lower quadrant pain: Secondary | ICD-10-CM | POA: Diagnosis not present

## 2019-06-03 DIAGNOSIS — I10 Essential (primary) hypertension: Secondary | ICD-10-CM | POA: Diagnosis not present

## 2019-06-03 DIAGNOSIS — E871 Hypo-osmolality and hyponatremia: Secondary | ICD-10-CM | POA: Diagnosis not present

## 2019-06-03 DIAGNOSIS — I129 Hypertensive chronic kidney disease with stage 1 through stage 4 chronic kidney disease, or unspecified chronic kidney disease: Secondary | ICD-10-CM | POA: Diagnosis not present

## 2019-06-03 DIAGNOSIS — E1010 Type 1 diabetes mellitus with ketoacidosis without coma: Secondary | ICD-10-CM | POA: Diagnosis not present

## 2019-06-03 DIAGNOSIS — R1084 Generalized abdominal pain: Secondary | ICD-10-CM | POA: Diagnosis not present

## 2019-06-03 DIAGNOSIS — Z743 Need for continuous supervision: Secondary | ICD-10-CM | POA: Diagnosis not present

## 2019-06-03 DIAGNOSIS — N132 Hydronephrosis with renal and ureteral calculous obstruction: Secondary | ICD-10-CM | POA: Diagnosis not present

## 2019-06-03 DIAGNOSIS — R0902 Hypoxemia: Secondary | ICD-10-CM | POA: Diagnosis not present

## 2019-06-03 DIAGNOSIS — Z7952 Long term (current) use of systemic steroids: Secondary | ICD-10-CM | POA: Diagnosis not present

## 2019-06-03 DIAGNOSIS — E1110 Type 2 diabetes mellitus with ketoacidosis without coma: Secondary | ICD-10-CM | POA: Diagnosis not present

## 2019-06-03 DIAGNOSIS — N183 Chronic kidney disease, stage 3 unspecified: Secondary | ICD-10-CM | POA: Diagnosis not present

## 2019-06-03 DIAGNOSIS — Z79899 Other long term (current) drug therapy: Secondary | ICD-10-CM | POA: Diagnosis not present

## 2019-06-03 DIAGNOSIS — Z794 Long term (current) use of insulin: Secondary | ICD-10-CM | POA: Diagnosis not present

## 2019-06-03 DIAGNOSIS — R9431 Abnormal electrocardiogram [ECG] [EKG]: Secondary | ICD-10-CM | POA: Diagnosis not present

## 2019-06-03 DIAGNOSIS — M1990 Unspecified osteoarthritis, unspecified site: Secondary | ICD-10-CM | POA: Diagnosis not present

## 2019-06-03 DIAGNOSIS — E1165 Type 2 diabetes mellitus with hyperglycemia: Secondary | ICD-10-CM | POA: Diagnosis not present

## 2019-06-03 DIAGNOSIS — R000 Tachycardia, unspecified: Secondary | ICD-10-CM | POA: Diagnosis not present

## 2019-06-03 DIAGNOSIS — G4733 Obstructive sleep apnea (adult) (pediatric): Secondary | ICD-10-CM | POA: Diagnosis not present

## 2019-06-03 DIAGNOSIS — E1140 Type 2 diabetes mellitus with diabetic neuropathy, unspecified: Secondary | ICD-10-CM | POA: Diagnosis not present

## 2019-06-03 DIAGNOSIS — K7200 Acute and subacute hepatic failure without coma: Secondary | ICD-10-CM | POA: Diagnosis not present

## 2019-06-04 DIAGNOSIS — R112 Nausea with vomiting, unspecified: Secondary | ICD-10-CM | POA: Diagnosis not present

## 2019-06-04 DIAGNOSIS — Z91041 Radiographic dye allergy status: Secondary | ICD-10-CM | POA: Diagnosis not present

## 2019-06-04 DIAGNOSIS — E1165 Type 2 diabetes mellitus with hyperglycemia: Secondary | ICD-10-CM | POA: Diagnosis not present

## 2019-06-04 DIAGNOSIS — E1110 Type 2 diabetes mellitus with ketoacidosis without coma: Secondary | ICD-10-CM | POA: Diagnosis not present

## 2019-06-04 DIAGNOSIS — Z882 Allergy status to sulfonamides status: Secondary | ICD-10-CM | POA: Diagnosis not present

## 2019-06-04 DIAGNOSIS — R1031 Right lower quadrant pain: Secondary | ICD-10-CM | POA: Diagnosis not present

## 2019-06-04 DIAGNOSIS — E119 Type 2 diabetes mellitus without complications: Secondary | ICD-10-CM | POA: Diagnosis not present

## 2019-06-04 DIAGNOSIS — I10 Essential (primary) hypertension: Secondary | ICD-10-CM | POA: Diagnosis not present

## 2019-06-04 DIAGNOSIS — E872 Acidosis: Secondary | ICD-10-CM | POA: Diagnosis not present

## 2019-06-04 DIAGNOSIS — I129 Hypertensive chronic kidney disease with stage 1 through stage 4 chronic kidney disease, or unspecified chronic kidney disease: Secondary | ICD-10-CM | POA: Diagnosis not present

## 2019-06-04 DIAGNOSIS — R0602 Shortness of breath: Secondary | ICD-10-CM | POA: Diagnosis not present

## 2019-06-04 DIAGNOSIS — Z794 Long term (current) use of insulin: Secondary | ICD-10-CM | POA: Diagnosis not present

## 2019-06-04 DIAGNOSIS — Z880 Allergy status to penicillin: Secondary | ICD-10-CM | POA: Diagnosis not present

## 2019-06-04 DIAGNOSIS — N201 Calculus of ureter: Secondary | ICD-10-CM | POA: Diagnosis not present

## 2019-06-04 DIAGNOSIS — J45909 Unspecified asthma, uncomplicated: Secondary | ICD-10-CM | POA: Diagnosis not present

## 2019-06-04 DIAGNOSIS — R000 Tachycardia, unspecified: Secondary | ICD-10-CM | POA: Diagnosis not present

## 2019-06-04 DIAGNOSIS — R109 Unspecified abdominal pain: Secondary | ICD-10-CM | POA: Diagnosis not present

## 2019-06-04 DIAGNOSIS — Z79899 Other long term (current) drug therapy: Secondary | ICD-10-CM | POA: Diagnosis not present

## 2019-06-04 DIAGNOSIS — Z9049 Acquired absence of other specified parts of digestive tract: Secondary | ICD-10-CM | POA: Diagnosis not present

## 2019-06-04 DIAGNOSIS — Z91040 Latex allergy status: Secondary | ICD-10-CM | POA: Diagnosis not present

## 2019-06-04 DIAGNOSIS — N132 Hydronephrosis with renal and ureteral calculous obstruction: Secondary | ICD-10-CM | POA: Diagnosis not present

## 2019-06-05 DIAGNOSIS — Z881 Allergy status to other antibiotic agents status: Secondary | ICD-10-CM | POA: Diagnosis not present

## 2019-06-05 DIAGNOSIS — Z794 Long term (current) use of insulin: Secondary | ICD-10-CM | POA: Diagnosis not present

## 2019-06-05 DIAGNOSIS — M7989 Other specified soft tissue disorders: Secondary | ICD-10-CM | POA: Diagnosis not present

## 2019-06-05 DIAGNOSIS — R410 Disorientation, unspecified: Secondary | ICD-10-CM | POA: Diagnosis not present

## 2019-06-05 DIAGNOSIS — E1110 Type 2 diabetes mellitus with ketoacidosis without coma: Secondary | ICD-10-CM

## 2019-06-05 DIAGNOSIS — E1165 Type 2 diabetes mellitus with hyperglycemia: Secondary | ICD-10-CM | POA: Diagnosis not present

## 2019-06-05 DIAGNOSIS — U071 COVID-19: Secondary | ICD-10-CM | POA: Insufficient documentation

## 2019-06-05 DIAGNOSIS — I10 Essential (primary) hypertension: Secondary | ICD-10-CM | POA: Diagnosis not present

## 2019-06-05 DIAGNOSIS — K219 Gastro-esophageal reflux disease without esophagitis: Secondary | ICD-10-CM | POA: Diagnosis not present

## 2019-06-05 DIAGNOSIS — Z91041 Radiographic dye allergy status: Secondary | ICD-10-CM | POA: Diagnosis not present

## 2019-06-05 DIAGNOSIS — R2231 Localized swelling, mass and lump, right upper limb: Secondary | ICD-10-CM | POA: Diagnosis not present

## 2019-06-05 DIAGNOSIS — Z91040 Latex allergy status: Secondary | ICD-10-CM | POA: Diagnosis not present

## 2019-06-05 DIAGNOSIS — Z882 Allergy status to sulfonamides status: Secondary | ICD-10-CM | POA: Diagnosis not present

## 2019-06-05 DIAGNOSIS — Z880 Allergy status to penicillin: Secondary | ICD-10-CM | POA: Diagnosis not present

## 2019-06-05 DIAGNOSIS — Z79899 Other long term (current) drug therapy: Secondary | ICD-10-CM | POA: Diagnosis not present

## 2019-06-05 DIAGNOSIS — Z9049 Acquired absence of other specified parts of digestive tract: Secondary | ICD-10-CM | POA: Diagnosis not present

## 2019-06-05 DIAGNOSIS — G8929 Other chronic pain: Secondary | ICD-10-CM | POA: Diagnosis not present

## 2019-06-05 DIAGNOSIS — R000 Tachycardia, unspecified: Secondary | ICD-10-CM | POA: Diagnosis not present

## 2019-06-05 DIAGNOSIS — D6859 Other primary thrombophilia: Secondary | ICD-10-CM | POA: Diagnosis not present

## 2019-06-05 DIAGNOSIS — R0602 Shortness of breath: Secondary | ICD-10-CM | POA: Diagnosis not present

## 2019-06-05 DIAGNOSIS — D689 Coagulation defect, unspecified: Secondary | ICD-10-CM | POA: Diagnosis not present

## 2019-06-05 DIAGNOSIS — E1122 Type 2 diabetes mellitus with diabetic chronic kidney disease: Secondary | ICD-10-CM | POA: Diagnosis not present

## 2019-06-05 DIAGNOSIS — E872 Acidosis: Secondary | ICD-10-CM | POA: Diagnosis not present

## 2019-06-05 DIAGNOSIS — E876 Hypokalemia: Secondary | ICD-10-CM | POA: Diagnosis not present

## 2019-06-05 DIAGNOSIS — I129 Hypertensive chronic kidney disease with stage 1 through stage 4 chronic kidney disease, or unspecified chronic kidney disease: Secondary | ICD-10-CM | POA: Diagnosis not present

## 2019-06-05 DIAGNOSIS — N183 Chronic kidney disease, stage 3 unspecified: Secondary | ICD-10-CM | POA: Diagnosis not present

## 2019-06-05 DIAGNOSIS — J449 Chronic obstructive pulmonary disease, unspecified: Secondary | ICD-10-CM | POA: Diagnosis not present

## 2019-06-05 HISTORY — DX: Type 2 diabetes mellitus with ketoacidosis without coma: E11.10

## 2019-06-05 HISTORY — DX: COVID-19: U07.1

## 2019-06-06 DIAGNOSIS — R410 Disorientation, unspecified: Secondary | ICD-10-CM | POA: Insufficient documentation

## 2019-06-06 DIAGNOSIS — R112 Nausea with vomiting, unspecified: Secondary | ICD-10-CM | POA: Insufficient documentation

## 2019-06-06 DIAGNOSIS — R1110 Vomiting, unspecified: Secondary | ICD-10-CM | POA: Insufficient documentation

## 2019-06-06 HISTORY — DX: Vomiting, unspecified: R11.10

## 2019-06-06 HISTORY — DX: Disorientation, unspecified: R41.0

## 2019-06-14 DIAGNOSIS — Z79899 Other long term (current) drug therapy: Secondary | ICD-10-CM | POA: Diagnosis not present

## 2019-06-14 DIAGNOSIS — E1110 Type 2 diabetes mellitus with ketoacidosis without coma: Secondary | ICD-10-CM | POA: Diagnosis not present

## 2019-06-14 DIAGNOSIS — Z8616 Personal history of COVID-19: Secondary | ICD-10-CM | POA: Diagnosis not present

## 2019-06-14 DIAGNOSIS — Z5181 Encounter for therapeutic drug level monitoring: Secondary | ICD-10-CM | POA: Diagnosis not present

## 2019-06-14 DIAGNOSIS — G894 Chronic pain syndrome: Secondary | ICD-10-CM | POA: Diagnosis not present

## 2019-06-15 ENCOUNTER — Other Ambulatory Visit: Payer: Self-pay

## 2019-06-15 ENCOUNTER — Ambulatory Visit (INDEPENDENT_AMBULATORY_CARE_PROVIDER_SITE_OTHER): Payer: Medicare Other | Admitting: Podiatry

## 2019-06-15 DIAGNOSIS — Z5321 Procedure and treatment not carried out due to patient leaving prior to being seen by health care provider: Secondary | ICD-10-CM

## 2019-06-15 NOTE — Progress Notes (Signed)
Patient ID: Sarah Maldonado, female   DOB: May 27, 1970, 49 y.o.   MRN: [id number]  Chief Complaint  Patient presents with  . nail check    F/U Rt hallxu nail check -pt staets," it's been doing so-so, since I stumped it again." -pt denies pain/redness/drainage -w/ slight swellgin tx: epsom salt  . Diabetes    BFS: 134 a1C: 14.4    Patient was triaged and then had to leave for an emergency. She was not seen by the provider. She will be reappointed.

## 2019-06-22 ENCOUNTER — Other Ambulatory Visit: Payer: Self-pay

## 2019-06-22 ENCOUNTER — Ambulatory Visit (INDEPENDENT_AMBULATORY_CARE_PROVIDER_SITE_OTHER): Payer: Medicare Other | Admitting: Podiatry

## 2019-06-22 DIAGNOSIS — N200 Calculus of kidney: Secondary | ICD-10-CM | POA: Diagnosis not present

## 2019-06-22 DIAGNOSIS — S92321D Displaced fracture of second metatarsal bone, right foot, subsequent encounter for fracture with routine healing: Secondary | ICD-10-CM | POA: Diagnosis not present

## 2019-06-22 DIAGNOSIS — L601 Onycholysis: Secondary | ICD-10-CM | POA: Diagnosis not present

## 2019-06-22 DIAGNOSIS — B3789 Other sites of candidiasis: Secondary | ICD-10-CM | POA: Diagnosis not present

## 2019-06-22 NOTE — Progress Notes (Signed)
  Subjective:  Patient ID: Sarah Maldonado, female    DOB: Nov 27, 1970,  MRN: [id number]  Chief Complaint  Patient presents with  . nail check    F/U Rt hallux nail check pt. states," it's alright; 4-5/10 pain." -pt denis redness/swellgin/driange tx: epsoms salt soaking   . Diabetes    FBS: 245 x SUnd A1C: 14.4   49 y.o. female presents with the above complaint. History confirmed with patient.   Objective:  Physical Exam: warm, good capillary refill, no trophic changes or ulcerative lesions, normal DP and PT pulses and normal sensory exam. Right Foot: no pain at the 2nd metatarsal base.Right hallux nail bed well healed no active drainage or signs of infection. No images are attached to the encounter.   Assessment:   1. Closed displaced fracture of second metatarsal bone of right foot with routine healing, subsequent encounter   2. Onycholysis    Plan:  Patient was evaluated and treated and all questions answered.  Right Lisfranc Fracture -No pain on exam. Will continue to monitor. F/u in 6 weeks with new XR. May need delayed fusion at a later date if A1c within range.  Nail Lysis Right Hallux Nail -Nail healing well without issue   No follow-ups on file.

## 2019-06-23 DIAGNOSIS — K219 Gastro-esophageal reflux disease without esophagitis: Secondary | ICD-10-CM | POA: Diagnosis not present

## 2019-06-23 DIAGNOSIS — Z87442 Personal history of urinary calculi: Secondary | ICD-10-CM | POA: Diagnosis not present

## 2019-06-23 DIAGNOSIS — E1165 Type 2 diabetes mellitus with hyperglycemia: Secondary | ICD-10-CM | POA: Diagnosis not present

## 2019-06-23 DIAGNOSIS — G8929 Other chronic pain: Secondary | ICD-10-CM | POA: Diagnosis not present

## 2019-06-23 DIAGNOSIS — G43909 Migraine, unspecified, not intractable, without status migrainosus: Secondary | ICD-10-CM | POA: Diagnosis not present

## 2019-06-23 DIAGNOSIS — I129 Hypertensive chronic kidney disease with stage 1 through stage 4 chronic kidney disease, or unspecified chronic kidney disease: Secondary | ICD-10-CM | POA: Diagnosis not present

## 2019-06-23 DIAGNOSIS — N183 Chronic kidney disease, stage 3 unspecified: Secondary | ICD-10-CM | POA: Diagnosis not present

## 2019-06-23 DIAGNOSIS — E785 Hyperlipidemia, unspecified: Secondary | ICD-10-CM | POA: Diagnosis not present

## 2019-06-23 DIAGNOSIS — J449 Chronic obstructive pulmonary disease, unspecified: Secondary | ICD-10-CM | POA: Diagnosis not present

## 2019-06-23 DIAGNOSIS — Z794 Long term (current) use of insulin: Secondary | ICD-10-CM | POA: Diagnosis not present

## 2019-06-23 DIAGNOSIS — M1990 Unspecified osteoarthritis, unspecified site: Secondary | ICD-10-CM | POA: Diagnosis not present

## 2019-06-23 DIAGNOSIS — Z8616 Personal history of COVID-19: Secondary | ICD-10-CM | POA: Diagnosis not present

## 2019-06-23 DIAGNOSIS — E1122 Type 2 diabetes mellitus with diabetic chronic kidney disease: Secondary | ICD-10-CM | POA: Diagnosis not present

## 2019-06-28 DIAGNOSIS — J449 Chronic obstructive pulmonary disease, unspecified: Secondary | ICD-10-CM | POA: Diagnosis not present

## 2019-06-28 DIAGNOSIS — E1122 Type 2 diabetes mellitus with diabetic chronic kidney disease: Secondary | ICD-10-CM | POA: Diagnosis not present

## 2019-06-28 DIAGNOSIS — Z87442 Personal history of urinary calculi: Secondary | ICD-10-CM | POA: Diagnosis not present

## 2019-06-28 DIAGNOSIS — N183 Chronic kidney disease, stage 3 unspecified: Secondary | ICD-10-CM | POA: Diagnosis not present

## 2019-06-28 DIAGNOSIS — M1990 Unspecified osteoarthritis, unspecified site: Secondary | ICD-10-CM | POA: Diagnosis not present

## 2019-06-28 DIAGNOSIS — E1165 Type 2 diabetes mellitus with hyperglycemia: Secondary | ICD-10-CM | POA: Diagnosis not present

## 2019-06-28 DIAGNOSIS — G8929 Other chronic pain: Secondary | ICD-10-CM | POA: Diagnosis not present

## 2019-06-28 DIAGNOSIS — Z794 Long term (current) use of insulin: Secondary | ICD-10-CM | POA: Diagnosis not present

## 2019-06-28 DIAGNOSIS — E785 Hyperlipidemia, unspecified: Secondary | ICD-10-CM | POA: Diagnosis not present

## 2019-06-28 DIAGNOSIS — G43909 Migraine, unspecified, not intractable, without status migrainosus: Secondary | ICD-10-CM | POA: Diagnosis not present

## 2019-06-28 DIAGNOSIS — K219 Gastro-esophageal reflux disease without esophagitis: Secondary | ICD-10-CM | POA: Diagnosis not present

## 2019-06-28 DIAGNOSIS — I129 Hypertensive chronic kidney disease with stage 1 through stage 4 chronic kidney disease, or unspecified chronic kidney disease: Secondary | ICD-10-CM | POA: Diagnosis not present

## 2019-06-28 DIAGNOSIS — Z8616 Personal history of COVID-19: Secondary | ICD-10-CM | POA: Diagnosis not present

## 2019-07-12 DIAGNOSIS — Z5181 Encounter for therapeutic drug level monitoring: Secondary | ICD-10-CM | POA: Diagnosis not present

## 2019-07-12 DIAGNOSIS — G894 Chronic pain syndrome: Secondary | ICD-10-CM | POA: Diagnosis not present

## 2019-07-12 DIAGNOSIS — Z79899 Other long term (current) drug therapy: Secondary | ICD-10-CM | POA: Diagnosis not present

## 2019-08-03 ENCOUNTER — Other Ambulatory Visit: Payer: Self-pay | Admitting: Podiatry

## 2019-08-03 ENCOUNTER — Other Ambulatory Visit: Payer: Self-pay

## 2019-08-03 ENCOUNTER — Ambulatory Visit (INDEPENDENT_AMBULATORY_CARE_PROVIDER_SITE_OTHER): Payer: Medicare Other

## 2019-08-03 ENCOUNTER — Ambulatory Visit (INDEPENDENT_AMBULATORY_CARE_PROVIDER_SITE_OTHER): Payer: Medicare Other | Admitting: Podiatry

## 2019-08-03 DIAGNOSIS — S92321D Displaced fracture of second metatarsal bone, right foot, subsequent encounter for fracture with routine healing: Secondary | ICD-10-CM

## 2019-08-03 DIAGNOSIS — M79671 Pain in right foot: Secondary | ICD-10-CM

## 2019-08-09 DIAGNOSIS — Z5181 Encounter for therapeutic drug level monitoring: Secondary | ICD-10-CM | POA: Diagnosis not present

## 2019-08-09 DIAGNOSIS — Z79899 Other long term (current) drug therapy: Secondary | ICD-10-CM | POA: Diagnosis not present

## 2019-08-09 DIAGNOSIS — G894 Chronic pain syndrome: Secondary | ICD-10-CM | POA: Diagnosis not present

## 2019-09-10 DIAGNOSIS — E1140 Type 2 diabetes mellitus with diabetic neuropathy, unspecified: Secondary | ICD-10-CM | POA: Diagnosis not present

## 2019-09-10 DIAGNOSIS — Z5181 Encounter for therapeutic drug level monitoring: Secondary | ICD-10-CM | POA: Diagnosis not present

## 2019-09-10 DIAGNOSIS — Z794 Long term (current) use of insulin: Secondary | ICD-10-CM | POA: Diagnosis not present

## 2019-09-10 DIAGNOSIS — E1165 Type 2 diabetes mellitus with hyperglycemia: Secondary | ICD-10-CM | POA: Diagnosis not present

## 2019-09-10 DIAGNOSIS — G894 Chronic pain syndrome: Secondary | ICD-10-CM | POA: Diagnosis not present

## 2019-10-06 NOTE — Progress Notes (Signed)
  Subjective:  Patient ID: Sarah Maldonado, female    DOB: 22-Sep-1970,  MRN: [id number]  Chief Complaint  Patient presents with  . Fracture    F/U Rt fx pt. states," started having aches the day before yesterday at the top of my foot; 8/10." -w/ less pain today -pt dneis swelling tx; none   . Diabetes    FBS: 514 a1C: 52   49 y.o. female presents with the above complaint. History confirmed with patient.   Objective:  Physical Exam: warm, good capillary refill, no trophic changes or ulcerative lesions, normal DP and PT pulses and normal sensory exam. Right Foot: no pain at the 2nd metatarsal base.  Assessment:   1. Closed displaced fracture of second metatarsal bone of right foot with routine healing, subsequent encounter    Plan:  Patient was evaluated and treated and all questions answered.  Right Lisfranc Fracture -Overall healing well. Minimal pain. May need delayed fusion at a later date if A1c within range.   No follow-ups on file.

## 2019-10-11 DIAGNOSIS — G894 Chronic pain syndrome: Secondary | ICD-10-CM | POA: Diagnosis not present

## 2019-10-11 DIAGNOSIS — E1140 Type 2 diabetes mellitus with diabetic neuropathy, unspecified: Secondary | ICD-10-CM | POA: Diagnosis not present

## 2019-10-11 DIAGNOSIS — E1165 Type 2 diabetes mellitus with hyperglycemia: Secondary | ICD-10-CM | POA: Diagnosis not present

## 2019-10-11 DIAGNOSIS — Z5181 Encounter for therapeutic drug level monitoring: Secondary | ICD-10-CM | POA: Diagnosis not present

## 2019-10-11 DIAGNOSIS — Z794 Long term (current) use of insulin: Secondary | ICD-10-CM | POA: Diagnosis not present

## 2019-10-12 ENCOUNTER — Other Ambulatory Visit: Payer: Self-pay | Admitting: Podiatry

## 2019-10-12 DIAGNOSIS — S92321D Displaced fracture of second metatarsal bone, right foot, subsequent encounter for fracture with routine healing: Secondary | ICD-10-CM

## 2019-10-16 DIAGNOSIS — E1140 Type 2 diabetes mellitus with diabetic neuropathy, unspecified: Secondary | ICD-10-CM | POA: Diagnosis not present

## 2019-10-16 DIAGNOSIS — Z794 Long term (current) use of insulin: Secondary | ICD-10-CM | POA: Diagnosis not present

## 2020-01-07 ENCOUNTER — Ambulatory Visit: Payer: Medicare Other | Admitting: Podiatry

## 2020-01-11 ENCOUNTER — Ambulatory Visit: Payer: 59 | Admitting: Sports Medicine

## 2020-01-27 ENCOUNTER — Other Ambulatory Visit: Payer: Self-pay

## 2020-01-27 ENCOUNTER — Other Ambulatory Visit: Payer: Self-pay | Admitting: Podiatry

## 2020-01-27 ENCOUNTER — Ambulatory Visit (INDEPENDENT_AMBULATORY_CARE_PROVIDER_SITE_OTHER): Payer: 59

## 2020-01-27 ENCOUNTER — Ambulatory Visit (INDEPENDENT_AMBULATORY_CARE_PROVIDER_SITE_OTHER): Payer: 59 | Admitting: Podiatry

## 2020-01-27 DIAGNOSIS — S92321D Displaced fracture of second metatarsal bone, right foot, subsequent encounter for fracture with routine healing: Secondary | ICD-10-CM

## 2020-01-27 DIAGNOSIS — S99922A Unspecified injury of left foot, initial encounter: Secondary | ICD-10-CM | POA: Diagnosis not present

## 2020-01-27 DIAGNOSIS — S9031XA Contusion of right foot, initial encounter: Secondary | ICD-10-CM | POA: Diagnosis not present

## 2020-01-31 NOTE — Progress Notes (Signed)
  Subjective:  Patient ID: Sarah Maldonado, female    DOB: [birth year],  MRN: [id number]  No chief complaint on file.   49 y.o. female presents with the above complaint. History confirmed with patient. Reports pain in the right foot is concerned she has fractured it again.  Objective:  Physical Exam: warm, good capillary refill, no trophic changes or ulcerative lesions, normal DP and PT pulses and normal sensory exam.  Right Foot: POP right 5th metatarsal area   No images are attached to the encounter.  Radiographs: X-ray of the right foot: no new fracture noted, healing 2nd met fracture with surrounding arthritic changes. Assessment:   1. Contusion of right foot, initial encounter   2. Closed displaced fracture of second metatarsal bone of right foot with routine healing, subsequent encounter      Plan:  Patient was evaluated and treated and all questions answered.  Hx right Lisfranc fracture -Appears healed, no pain on exam today  Contusion right foot -XR taken, no evidence of fracture -F/u should pain persist.  Procedure: Soft Cast Application with Unna Boot Rationale: above injury Technique: Unna boot, cast padding, Coban compression dressing applied Disposition: Patient tolerated procedure well. NV status intact post-application.   No follow-ups on file.

## 2020-02-10 ENCOUNTER — Encounter: Payer: Self-pay | Admitting: Podiatry

## 2020-02-10 ENCOUNTER — Ambulatory Visit (INDEPENDENT_AMBULATORY_CARE_PROVIDER_SITE_OTHER): Payer: 59

## 2020-02-10 ENCOUNTER — Other Ambulatory Visit: Payer: Self-pay

## 2020-02-10 ENCOUNTER — Ambulatory Visit (INDEPENDENT_AMBULATORY_CARE_PROVIDER_SITE_OTHER): Payer: 59 | Admitting: Podiatry

## 2020-02-10 ENCOUNTER — Other Ambulatory Visit: Payer: Self-pay | Admitting: Podiatry

## 2020-02-10 DIAGNOSIS — S9031XA Contusion of right foot, initial encounter: Secondary | ICD-10-CM | POA: Diagnosis not present

## 2020-02-10 DIAGNOSIS — S92321D Displaced fracture of second metatarsal bone, right foot, subsequent encounter for fracture with routine healing: Secondary | ICD-10-CM | POA: Diagnosis not present

## 2020-02-10 DIAGNOSIS — M779 Enthesopathy, unspecified: Secondary | ICD-10-CM

## 2020-02-23 NOTE — Progress Notes (Signed)
  Subjective:  Patient ID: Sarah Maldonado, female    DOB: 1970/04/03,  MRN: [id number]  Chief Complaint  Patient presents with  . Foot Problem    the foot is painful and the unna boot did help and i am still wearing the air fracture walker    49 y.o. female presents with the above complaint. History confirmed with patient.   Objective:  Physical Exam: warm, good capillary refill, no trophic changes or ulcerative lesions, normal DP and PT pulses and normal sensory exam.  Right Foot: POP right 5th metatarsal area   No images are attached to the encounter.  Radiographs: X-ray of the right foot: no new fracture noted, healing 2nd met fracture with surrounding arthritic changes. Assessment:   1. Contusion of right foot, initial encounter   2. Closed displaced fracture of second metatarsal bone of right foot with routine healing, subsequent encounter      Plan:  Patient was evaluated and treated and all questions answered.  Hx right Lisfranc fracture -Appears healed, no pain on exam today  Contusion right foot -Improving, transition to trilock brace  No follow-ups on file.

## 2020-04-24 ENCOUNTER — Ambulatory Visit (INDEPENDENT_AMBULATORY_CARE_PROVIDER_SITE_OTHER): Payer: 59

## 2020-04-24 ENCOUNTER — Other Ambulatory Visit: Payer: Self-pay

## 2020-04-24 ENCOUNTER — Ambulatory Visit: Payer: 59

## 2020-04-24 ENCOUNTER — Encounter: Payer: Self-pay | Admitting: Podiatry

## 2020-04-24 ENCOUNTER — Other Ambulatory Visit: Payer: Self-pay | Admitting: Podiatry

## 2020-04-24 ENCOUNTER — Ambulatory Visit (INDEPENDENT_AMBULATORY_CARE_PROVIDER_SITE_OTHER): Payer: 59 | Admitting: Podiatry

## 2020-04-24 ENCOUNTER — Ambulatory Visit: Payer: 59 | Admitting: Podiatry

## 2020-04-24 DIAGNOSIS — R2681 Unsteadiness on feet: Secondary | ICD-10-CM | POA: Diagnosis not present

## 2020-04-24 DIAGNOSIS — M76829 Posterior tibial tendinitis, unspecified leg: Secondary | ICD-10-CM

## 2020-04-24 DIAGNOSIS — M76822 Posterior tibial tendinitis, left leg: Secondary | ICD-10-CM | POA: Diagnosis not present

## 2020-04-24 DIAGNOSIS — S92321D Displaced fracture of second metatarsal bone, right foot, subsequent encounter for fracture with routine healing: Secondary | ICD-10-CM

## 2020-04-24 DIAGNOSIS — M779 Enthesopathy, unspecified: Secondary | ICD-10-CM

## 2020-04-24 DIAGNOSIS — M2142 Flat foot [pes planus] (acquired), left foot: Secondary | ICD-10-CM

## 2020-04-24 NOTE — Progress Notes (Signed)
  Subjective:  Patient ID: Sarah Maldonado, female    DOB: 02/18/1971,  MRN: [id number]  Chief Complaint  Patient presents with  . Foot Problem    Hurts on the instep of the left foot     50 y.o. female presents with the above complaint. History confirmed with patient. States the brace helps temporarily but by the end of the day it is worse and she has to take the brace off  Objective:  Physical Exam: warm, good capillary refill, no trophic changes or ulcerative lesions, normal DP and PT pulses and normal sensory exam. Left Foot: left ankle edema over the medial ankle, POP PT tendon. Collapsing pes planovalgus with forefoot abduction left.   Radiographs:  X-ray of the left foot: no fracture, dislocation, swelling or degenerative changes noted, pes planus noted. Assessment:   1. PTTD (posterior tibial tendon dysfunction)   2. Posterior tibial tendonitis, left   3. Acquired pes planus, left   4. Gait instability      Plan:  Patient was evaluated and treated and all questions answered.  Left ankle PT tendonitis -XR reviewed. No osseous abnormality. -Order MRI for further review. Has failed ankle brace for >6 weeks. -Depending upon MRI results consider further anti-inflammatory therapy. -F/u in 4 weeks for MRI review -Continue immobilization in ankle brace until follow up -Tubigrip applied for reduction of edema. Return in about 1 week (around 05/01/2020) for MRI review.

## 2020-04-24 NOTE — Addendum Note (Signed)
Addended by: Hadley Pen R on: 04/24/2020 12:53 PM   Modules accepted: Orders

## 2020-04-28 ENCOUNTER — Telehealth: Payer: Self-pay | Admitting: *Deleted

## 2020-04-28 NOTE — Telephone Encounter (Signed)
We sent the patient's order for the MRI to [facility] due to insurance. Sarah Maldonado

## 2020-04-28 NOTE — Addendum Note (Signed)
Addended by: Hadley Pen R on: 04/28/2020 10:37 AM   Modules accepted: Orders

## 2020-04-28 NOTE — Telephone Encounter (Signed)
-----   Message from Park Liter, DPM sent at 04/24/2020 11:41 AM EST ----- Can we order MRI at [facility]?

## 2020-05-04 ENCOUNTER — Ambulatory Visit (INDEPENDENT_AMBULATORY_CARE_PROVIDER_SITE_OTHER): Payer: 59 | Admitting: Podiatry

## 2020-05-04 DIAGNOSIS — Z5329 Procedure and treatment not carried out because of patient's decision for other reasons: Secondary | ICD-10-CM

## 2020-05-04 NOTE — Progress Notes (Signed)
No show for appt. 

## 2020-05-07 ENCOUNTER — Other Ambulatory Visit: Payer: Self-pay

## 2020-05-07 ENCOUNTER — Ambulatory Visit
Admission: RE | Admit: 2020-05-07 | Discharge: 2020-05-07 | Disposition: A | Discharge status: Home / Self Care | Payer: Medicare Other | Source: Ambulatory Visit | Attending: Podiatry | Admitting: Podiatry

## 2020-05-07 DIAGNOSIS — M76822 Posterior tibial tendinitis, left leg: Secondary | ICD-10-CM

## 2020-05-07 DIAGNOSIS — M76829 Posterior tibial tendinitis, unspecified leg: Secondary | ICD-10-CM

## 2020-05-25 ENCOUNTER — Encounter: Payer: Self-pay | Admitting: Podiatry

## 2020-05-25 ENCOUNTER — Ambulatory Visit (INDEPENDENT_AMBULATORY_CARE_PROVIDER_SITE_OTHER): Payer: 59 | Admitting: Podiatry

## 2020-05-25 ENCOUNTER — Other Ambulatory Visit: Payer: Self-pay

## 2020-05-25 DIAGNOSIS — M76829 Posterior tibial tendinitis, unspecified leg: Secondary | ICD-10-CM | POA: Diagnosis not present

## 2020-05-25 DIAGNOSIS — M76822 Posterior tibial tendinitis, left leg: Secondary | ICD-10-CM | POA: Diagnosis not present

## 2020-05-25 DIAGNOSIS — M2142 Flat foot [pes planus] (acquired), left foot: Secondary | ICD-10-CM | POA: Diagnosis not present

## 2020-05-29 NOTE — Progress Notes (Signed)
  Subjective:  Patient ID: Sarah Maldonado, female    DOB: Jun 15, 1970,  MRN: [id number]  Chief Complaint  Patient presents with  . Foot Problem    I am still achy on the left foot and I am here to get the results of the MRI    50 y.o. female presents with the above complaint. History confirmed with patient.   Objective:  Physical Exam: warm, good capillary refill, no trophic changes or ulcerative lesions, normal DP and PT pulses and normal sensory exam. Left Foot: left ankle edema over the medial ankle, POP PT tendon. Collapsing pes planovalgus with forefoot abduction left.  Only mild pain laterally  Radiographs:  X-ray of the left foot: no fracture, dislocation, swelling or degenerative changes noted, pes planus noted. Assessment:   1. Posterior tibial tendonitis, left   2. PTTD (posterior tibial tendon dysfunction)   3. Acquired pes planus, left      Plan:  Patient was evaluated and treated and all questions answered.  Left ankle PT tendonitis, Fibular fracture  -Though she has pain in the medial aspect ankle her MRI does show healing fibular fracture on the lateral aspect.  Dispensed Tubigrip and will immobilize and Tri-Lock ankle brace.  Will monitor no operative intervention indicated at this time  No follow-ups on file.

## 2020-06-26 ENCOUNTER — Ambulatory Visit (INDEPENDENT_AMBULATORY_CARE_PROVIDER_SITE_OTHER): Payer: 59

## 2020-06-26 ENCOUNTER — Ambulatory Visit (INDEPENDENT_AMBULATORY_CARE_PROVIDER_SITE_OTHER): Payer: 59 | Admitting: Podiatry

## 2020-06-26 ENCOUNTER — Other Ambulatory Visit: Payer: Self-pay

## 2020-06-26 DIAGNOSIS — M76829 Posterior tibial tendinitis, unspecified leg: Secondary | ICD-10-CM | POA: Diagnosis not present

## 2020-06-26 DIAGNOSIS — M76822 Posterior tibial tendinitis, left leg: Secondary | ICD-10-CM

## 2020-06-26 DIAGNOSIS — R2681 Unsteadiness on feet: Secondary | ICD-10-CM | POA: Diagnosis not present

## 2020-06-26 DIAGNOSIS — M2142 Flat foot [pes planus] (acquired), left foot: Secondary | ICD-10-CM | POA: Diagnosis not present

## 2020-06-26 NOTE — Progress Notes (Signed)
  Subjective:  Patient ID: Sarah Maldonado, female    DOB: 06-30-1970,  MRN: [id number]  No chief complaint on file.   50 y.o. female presents with the above complaint. History confirmed with patient. States the ankle still hurts on the inside. Brace and the compression bandage help. Denies new issues.  Objective:  Physical Exam: warm, good capillary refill, no trophic changes or ulcerative lesions, normal DP and PT pulses and normal sensory exam. Left Foot: left ankle edema over the medial ankle, POP PT tendon. Collapsing pes planovalgus with forefoot abduction left.  Only mild pain to palpation laterally Assessment:   1. Posterior tibial tendonitis, left   2. PTTD (posterior tibial tendon dysfunction)   3. Acquired pes planus, left   4. Gait instability    Plan:  Patient was evaluated and treated and all questions answered.  Left ankle PT tendonitis, Fibular fracture -Continues to have pain at the medial aspect; secondary to underlying pes planus. She only has mild pain laterally. New XR show healing of the fibular fracture with malunion. Likely syndesmotic widening, increased clear space both malleoli. She needs a long term brace for support. Would consider surgical intervention however concern that her sugars remain uncontrolled. Will consider if brace does not alleviate symptoms.  Return in about 6 weeks (around 08/07/2020) for Pes planus, ankle fracture f/u.

## 2020-07-21 ENCOUNTER — Other Ambulatory Visit: Payer: 59

## 2020-08-07 ENCOUNTER — Ambulatory Visit (INDEPENDENT_AMBULATORY_CARE_PROVIDER_SITE_OTHER): Payer: 59 | Admitting: Podiatry

## 2020-08-07 ENCOUNTER — Other Ambulatory Visit: Payer: Self-pay

## 2020-08-07 DIAGNOSIS — Z5329 Procedure and treatment not carried out because of patient's decision for other reasons: Secondary | ICD-10-CM

## 2020-08-07 NOTE — Progress Notes (Signed)
No show for appt. 

## 2020-08-17 ENCOUNTER — Other Ambulatory Visit: Payer: Self-pay

## 2020-08-17 ENCOUNTER — Encounter: Payer: Self-pay | Admitting: Podiatry

## 2020-08-17 ENCOUNTER — Ambulatory Visit (INDEPENDENT_AMBULATORY_CARE_PROVIDER_SITE_OTHER): Payer: 59 | Admitting: Podiatry

## 2020-08-17 ENCOUNTER — Ambulatory Visit (INDEPENDENT_AMBULATORY_CARE_PROVIDER_SITE_OTHER): Payer: 59

## 2020-08-17 DIAGNOSIS — M2142 Flat foot [pes planus] (acquired), left foot: Secondary | ICD-10-CM | POA: Diagnosis not present

## 2020-08-17 DIAGNOSIS — R2681 Unsteadiness on feet: Secondary | ICD-10-CM | POA: Diagnosis not present

## 2020-08-17 DIAGNOSIS — M76821 Posterior tibial tendinitis, right leg: Secondary | ICD-10-CM | POA: Diagnosis not present

## 2020-08-17 DIAGNOSIS — M76822 Posterior tibial tendinitis, left leg: Secondary | ICD-10-CM

## 2020-08-17 MED ORDER — MELOXICAM 15 MG PO TABS: 15.0000 mg | ORAL_TABLET | Freq: Every day | ORAL | 0 refills | Status: AC

## 2020-08-17 NOTE — Progress Notes (Signed)
  Subjective:  Patient ID: Sarah Maldonado, female    DOB: 18-Dec-1970,  MRN: [id number]  Chief Complaint  Patient presents with  . Foot Problem    The brace doesn't help and makes my leg swell and it is killing me     50 y.o. female presents with the above complaint. History confirmed with patient. Did not get casted yet for the ankle brace. Her OTC brace is not helping.  Objective:  Physical Exam: warm, good capillary refill, no trophic changes or ulcerative lesions, normal DP and PT pulses and normal sensory exam. Left Foot: left ankle edema over the medial ankle, POP PT tendon. Collapsing pes planovalgus with forefoot abduction left. Pain central ankle. No pain to palpation laterally Assessment:   1. Posterior tibial tendonitis, left   2. Acquired pes planus, left   3. Gait instability    Plan:  Patient was evaluated and treated and all questions answered.  Left ankle PT tendonitis, Fibular fracture -Continues to only have pain medially. Pending eval for brace. New XR show full healing of the fracture with ankle valgus and incongruent ankle joint. Her last A1c is 10, precluding elective correction at this time without significant risk. Discussed should her sugars be better controlled we could consider surgical options however at this time recommend proceeding with brace immobilization.  Return in about 6 weeks (around 09/28/2020) for pes planus f/u.

## 2020-09-08 ENCOUNTER — Other Ambulatory Visit: Payer: 59

## 2020-09-12 ENCOUNTER — Other Ambulatory Visit: Payer: Self-pay | Admitting: Podiatry

## 2020-09-13 NOTE — Telephone Encounter (Signed)
Please advise 

## 2020-09-28 ENCOUNTER — Other Ambulatory Visit: Payer: Self-pay

## 2020-09-28 ENCOUNTER — Ambulatory Visit (INDEPENDENT_AMBULATORY_CARE_PROVIDER_SITE_OTHER): Payer: 59 | Admitting: Podiatry

## 2020-09-28 DIAGNOSIS — M76822 Posterior tibial tendinitis, left leg: Secondary | ICD-10-CM

## 2020-09-28 DIAGNOSIS — R2681 Unsteadiness on feet: Secondary | ICD-10-CM | POA: Diagnosis not present

## 2020-09-28 DIAGNOSIS — M2142 Flat foot [pes planus] (acquired), left foot: Secondary | ICD-10-CM

## 2020-09-28 NOTE — Progress Notes (Signed)
  Subjective:  Patient ID: Sarah Maldonado, female    DOB: [birth year],  MRN: [id number]  No chief complaint on file.  50 y.o. female presents with the above complaint. States the foot is doing ok, still with occasional pain. Concerned about pain and discoloration to her left great toenail today.  Objective:  Physical Exam: warm, good capillary refill, no trophic changes or ulcerative lesions, normal DP and PT pulses and normal sensory exam. Left Foot: left ankle edema over the medial ankle, POP PT tendon. Collapsing pes planovalgus with forefoot abduction left. Pain central ankle. No pain to palpation laterally. Left hallux medial ingrown nail. Assessment:   1. Posterior tibial tendonitis, left   2. Acquired pes planus, left   3. Gait instability     Plan:  Patient was evaluated and treated and all questions answered.  Left ankle PT tendonitis, Fibular fracture -Continues to only have pain medially. Pending eval for brace. New A1c is 14.5. She is not a candidate for surgery at this time. Hopefully her brace will help with pain. Should her sugars be controlled and she has continued pain we could consider surgical intervention.  Ingrown nail -palliative debridement. Abx ointment and band-aid applied.  No follow-ups on file.

## 2020-12-07 ENCOUNTER — Other Ambulatory Visit: Payer: Self-pay

## 2020-12-08 ENCOUNTER — Ambulatory Visit: Payer: 59 | Admitting: Cardiology

## 2020-12-08 ENCOUNTER — Telehealth: Payer: Self-pay

## 2020-12-08 ENCOUNTER — Other Ambulatory Visit: Payer: Self-pay

## 2020-12-08 DIAGNOSIS — G894 Chronic pain syndrome: Secondary | ICD-10-CM | POA: Insufficient documentation

## 2020-12-08 DIAGNOSIS — E119 Type 2 diabetes mellitus without complications: Secondary | ICD-10-CM | POA: Insufficient documentation

## 2020-12-08 DIAGNOSIS — F32A Depression, unspecified: Secondary | ICD-10-CM | POA: Insufficient documentation

## 2020-12-08 DIAGNOSIS — M1990 Unspecified osteoarthritis, unspecified site: Secondary | ICD-10-CM | POA: Insufficient documentation

## 2020-12-08 DIAGNOSIS — J45909 Unspecified asthma, uncomplicated: Secondary | ICD-10-CM | POA: Insufficient documentation

## 2020-12-08 DIAGNOSIS — I10 Essential (primary) hypertension: Secondary | ICD-10-CM | POA: Insufficient documentation

## 2020-12-08 DIAGNOSIS — E785 Hyperlipidemia, unspecified: Secondary | ICD-10-CM | POA: Insufficient documentation

## 2020-12-08 DIAGNOSIS — E781 Pure hyperglyceridemia: Secondary | ICD-10-CM | POA: Insufficient documentation

## 2020-12-08 DIAGNOSIS — K219 Gastro-esophageal reflux disease without esophagitis: Secondary | ICD-10-CM | POA: Insufficient documentation

## 2020-12-08 DIAGNOSIS — G4733 Obstructive sleep apnea (adult) (pediatric): Secondary | ICD-10-CM | POA: Insufficient documentation

## 2020-12-08 NOTE — Telephone Encounter (Signed)
Patient was scheduled for an appointment with Dr. Tomie China 12-08-20.  Upon bringing patient back for rooming, patient was asked to wear a mask per [facility] policy and informed me she was claustrophobic.  I informed her I would check with my manager as per our policy everyone must be masked.  After speaking with my manager, she went and informed the patient that she would not be seen without following masking requirements.  And the patient chose to leave. [facility] was called and made aware of the situation.

## 2021-03-15 ENCOUNTER — Ambulatory Visit (INDEPENDENT_AMBULATORY_CARE_PROVIDER_SITE_OTHER): Payer: 59 | Admitting: Podiatry

## 2021-03-15 ENCOUNTER — Encounter: Payer: Self-pay | Admitting: Podiatry

## 2021-03-15 DIAGNOSIS — E1169 Type 2 diabetes mellitus with other specified complication: Secondary | ICD-10-CM

## 2021-03-15 DIAGNOSIS — M2142 Flat foot [pes planus] (acquired), left foot: Secondary | ICD-10-CM

## 2021-03-15 DIAGNOSIS — M76822 Posterior tibial tendinitis, left leg: Secondary | ICD-10-CM | POA: Diagnosis not present

## 2021-03-15 DIAGNOSIS — B351 Tinea unguium: Secondary | ICD-10-CM

## 2021-03-15 DIAGNOSIS — M76829 Posterior tibial tendinitis, unspecified leg: Secondary | ICD-10-CM

## 2021-03-15 DIAGNOSIS — S93621D Sprain of tarsometatarsal ligament of right foot, subsequent encounter: Secondary | ICD-10-CM

## 2021-03-15 DIAGNOSIS — E118 Type 2 diabetes mellitus with unspecified complications: Secondary | ICD-10-CM

## 2021-03-15 DIAGNOSIS — R2681 Unsteadiness on feet: Secondary | ICD-10-CM

## 2021-03-15 NOTE — Progress Notes (Signed)
SITUATION Patient Name:  Sarah Maldonado MRN:   [id number] Reason for Visit: Evaluation for Speciality AFO  Patient Report: Chief Complaint:   Pain in left ankle Sarah Maldonado of Discomfort/Pain:  Ambulatory Location:    left lower extremity Onset & Duration:   Sudden Course:    unchanged Aggravating or Alleviating Factors: Movement  OBJECTIVE DATA & MEASUREMENTS Prognosis:    Good Duration of use:   5 years  Diagnosis:   ICD-10-CM   1. PTTD (posterior tibial tendon dysfunction)  M76.829     2. Type 2 diabetes mellitus with unspecified complications ([facility])  E11.8     3. Acquired pes planus, left  M21.42     4. Gait instability  R26.81     5. Sprain of tarsometatarsal joint of right foot, subsequent encounter  S93.621D       GOALS, NECESSITIES, & JUSTIFICATIONS Recommended Device: [place] Standard Custom Ankle Gauntlet Color:    Black Closure:   Laces  Laterality HCPCS Code Description Justification  left [id number] Plastic orthosis, custom molded from a model of the patient, custom fabricated, includes casting and cast preparation. Necessary to provide triplanar support to the foot/ankle complex  left L2330 Addition to lower extremity, lacer molded to patient model Necessary to ensure secure hold of orthosis to patient's limb  left L2820 Addition to lower extremity orthosis, soft interface for molded plastic below knee section Necessary to relieve pressure on bony prominences    I certify that Sarah Maldonado qualifies for and will benefit from an ankle foot orthosis used during ambulation based on meeting all of the following criteria;   The patient is: - Ambulatory, and - Has weakness or deformity of the foot and ankle, and - Requires stabilization for medical reasons, and - Has the potential to benefit functionally  The patients medical record contains sufficient documentation of the patients medical condition to substantiate the necessity for the type and quantity of the  items ordered.  The goals of this therapy: - Improve Mobility - Improve Lower Extremity Stability - Decrease Pain - Facilitate Soft Tissue Healing - Facilitate Immobilization, healing and treatment of an injury  Necessity of Ankle Foot Orthotic molded to patient model: A custom (vs. prefabricated) ankle foot orthosis has been prescribed based on the following criteria which are specific to the condition of this patient; - The patient could not be fit with a prefabricated AFO - The condition necessitating the orthosis is expected to be permanent or of longstanding duration (more than 6 months) - There is need to control the ankle or foot in more than one plane - The patient has a documented neurological, circulatory, or orthopedic condition that requires custom fabrication over a model to prevent tissue injury - The patient has a healing fracture that lacks normal anatomical integrity or anthropometric proportions  I hereby certify that the ankle foot orthotic described above is a rigid or semi-rigid device which is used for the purpose of supporting a weak or deformed body member or restricting or eliminating motion in a diseased or injured part of the body. It is designed to provide support and counterforce on the limb or body part that is being braced. In my opinion, the custom molded ankle foot orthosis is both reasonable and necessary in reference to accepted standards of medical practice in the treatment  of the patient condition and rehabilitation.  ACTIONS PERFORMED Patient was evaluated and casted for Specialty AFO via [employer]. Procedure was explained to patient. Patient tolerated  procedure. patient selected device color and closure method.   PLAN Patient to return in four to six weeks for fitting and delivery of device. Plan of care was explained to and agreed upon by patient. All questions were answered and concerns addressed.

## 2021-03-22 NOTE — Progress Notes (Signed)
°  Subjective:  Patient ID: Sarah Maldonado, female    DOB: 1970-12-29,  MRN: [id number]  Chief Complaint  Patient presents with   Nail Problem    Trim nails    Foot Pain    The right ankle is better and the left ankle swells and hurting     50 y.o. female presents with the above complaint.   Objective:  Physical Exam: warm, good capillary refill, no trophic changes or ulcerative lesions, normal DP and PT pulses and normal sensory exam. Left Foot: left ankle edema over the medial ankle, POP PT tendon. Collapsing pes planovalgus with forefoot abduction left. Pain central ankle. No pain to palpation laterally. Left hallux medial ingrown nail. Assessment:   1. Posterior tibial tendonitis, left   2. Acquired pes planus, left   3. Onychomycosis of multiple toenails with type 2 diabetes mellitus and peripheral neuropathy (HCC)    Plan:  Patient was evaluated and treated and all questions answered.  Left ankle PT tendonitis, Fibular fracture -Will have patient eval today for brace.  Onychomycosis, Diabetes, and DPN -Nails debrided x10  Procedure: Nail Debridement Type of Debridement: manual, sharp debridement. Instrumentation: Nail nipper, rotary burr. Number of Nails: 10   No follow-ups on file.

## 2021-04-12 ENCOUNTER — Telehealth: Payer: Self-pay | Admitting: Podiatry

## 2021-04-12 NOTE — Telephone Encounter (Signed)
Per Ricki Rodriguez # [facility] the cpt codes [phone] are valid and billable and no Berkley Harvey is required.. ded 233.00 out of pocket is [phone].Marland Kitchen pt has 20% coinsurance.. reference # [id number].Marland Kitchen

## 2021-04-18 ENCOUNTER — Other Ambulatory Visit: Payer: 59

## 2021-04-25 ENCOUNTER — Other Ambulatory Visit: Payer: 59

## 2021-05-16 ENCOUNTER — Other Ambulatory Visit: Payer: 59

## 2021-06-14 IMAGING — MR MR ANKLE*L* W/O CM
5 series · 40 of 40 positions shown · non-contrast
Comparison: Foot radiographs 04/24/2020 and 02/10/2020

CLINICAL DATA: Ankle pain 1 year after falling. Posterior tibial
tendinitis. No previous relevant surgery.

EXAM:
MRI OF THE LEFT ANKLE WITHOUT CONTRAST
TECHNIQUE: Multiplanar, multisequence MR imaging of the ankle was performed. No
intravenous contrast was administered.

[Series 5: PD fat-sat · axial · 3.5mm · 0.47mm/px · z∈[-120,+7]mm · 8 of 30 slices shown]
[im 1/30]
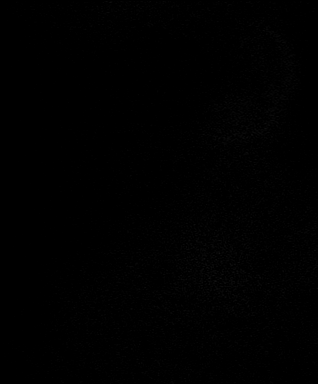
[im 5/30]
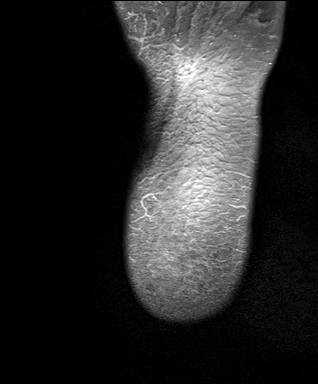
[im 9/30]
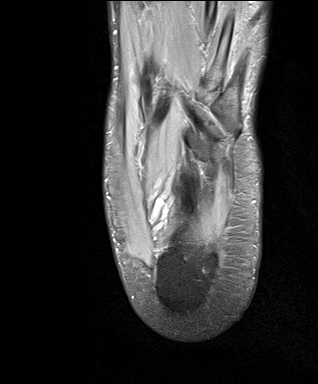
[im 13/30]
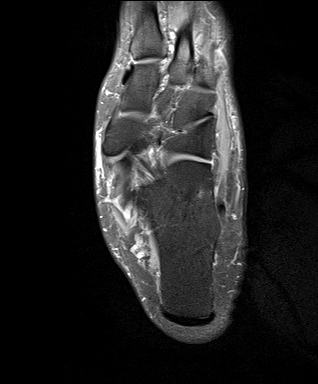
[im 17/30]
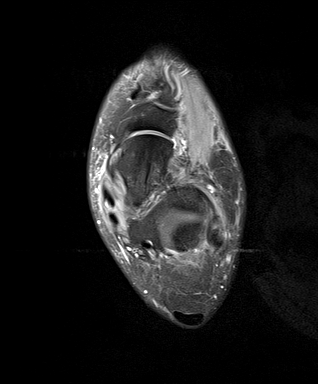
[im 21/30]
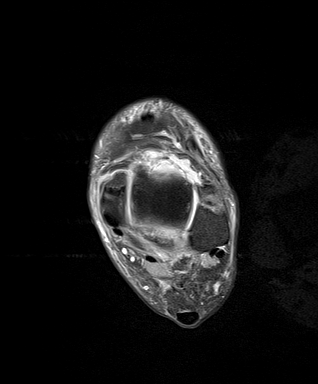
[im 25/30]
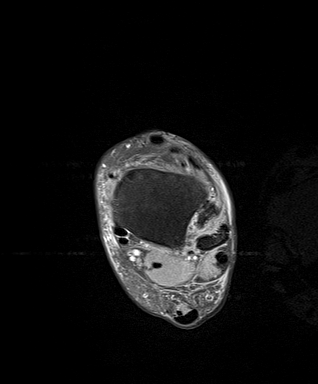
[im 30/30]
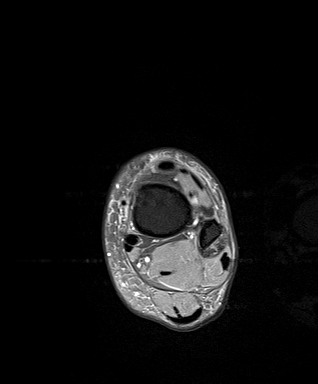

[Series 6: T2 fat-sat · axial · 3.0mm · 0.50mm/px · z∈[-126,+14]mm · 10 of 36 slices shown (1 of 2)]
[im 1/36]
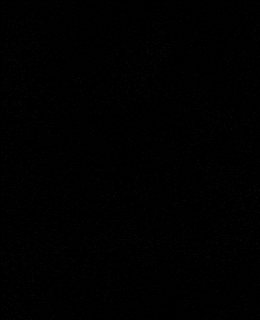
[im 4/36]
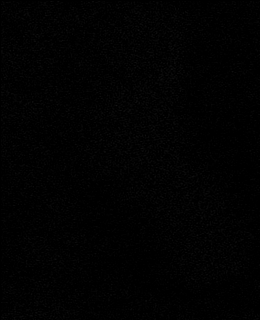
[im 8/36]
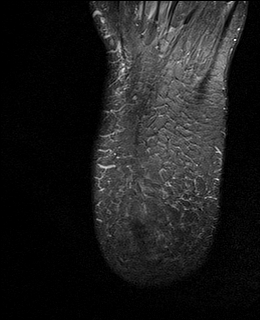
[im 12/36]
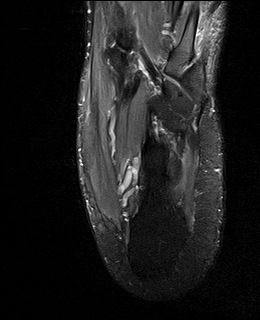
[im 16/36]
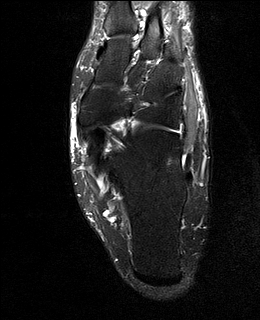
[im 20/36]
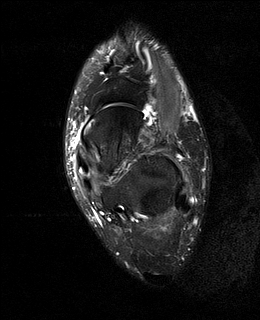
[im 24/36]
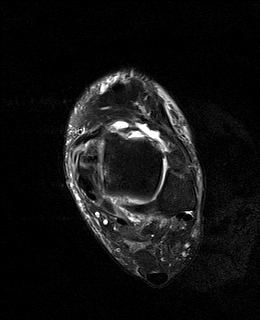
[im 28/36]
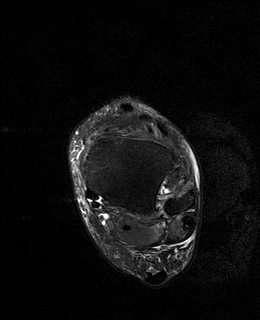
[im 32/36]
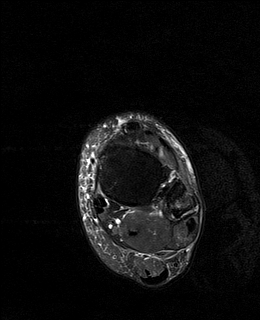
[im 36/36]
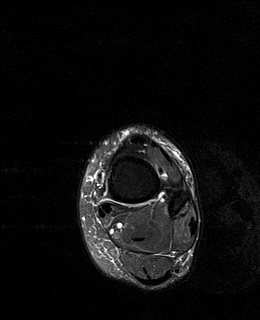

[Series 7: T2 fat-sat · coronal · 3.0mm · 0.62mm/px · 10 of 39 slices shown (2 of 2)]
[im 1/39]
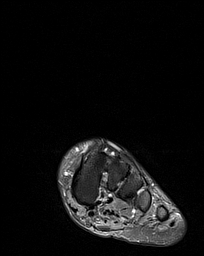
[im 5/39]
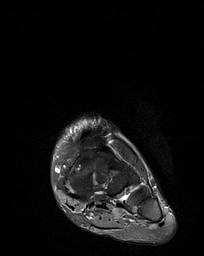
[im 9/39]
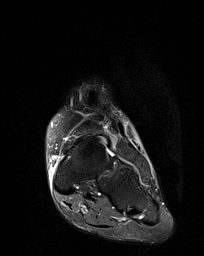
[im 13/39]
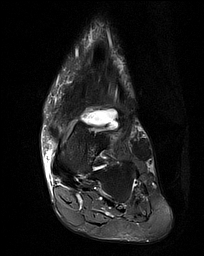
[im 17/39]
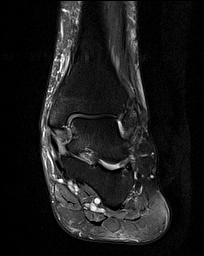
[im 22/39]
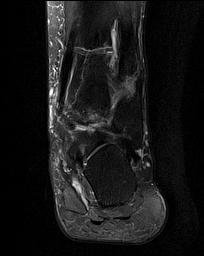
[im 26/39]
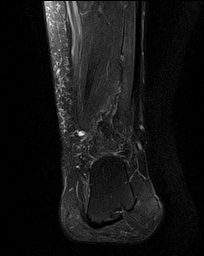
[im 30/39]
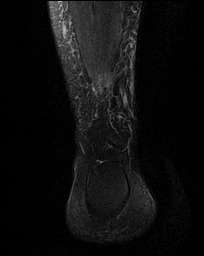
[im 34/39]
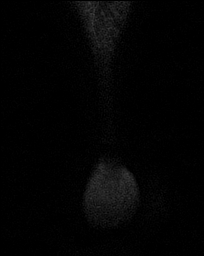
[im 39/39]
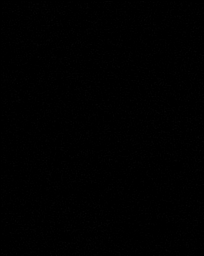

[Series 8: T1 · sagittal · 4.0mm · 0.56mm/px · 6 of 22 slices shown]
[im 1/22]
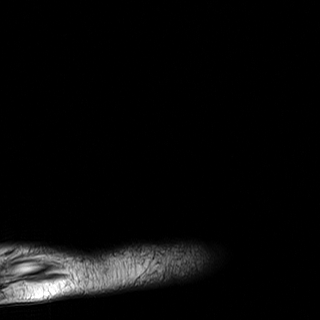
[im 5/22]
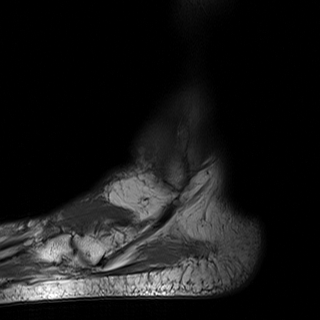
[im 9/22]
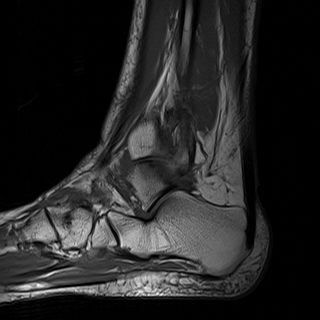
[im 13/22]
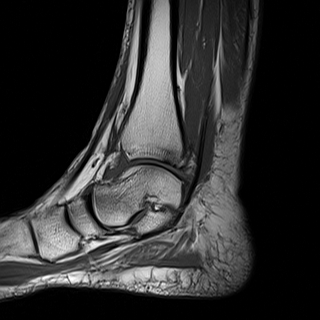
[im 17/22]
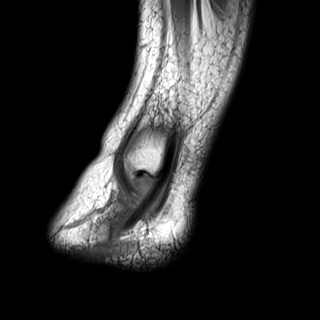
[im 22/22]
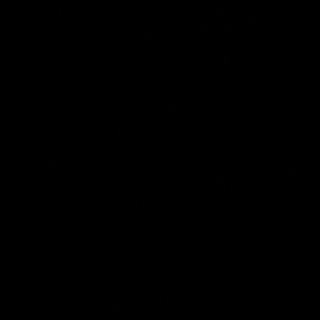

[Series 9: STIR · sagittal · 4.0mm · 0.70mm/px · 6 of 22 slices shown]
[im 1/22]
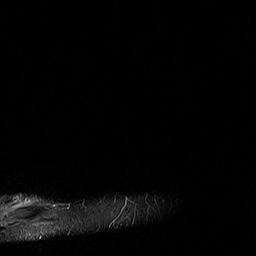
[im 5/22]
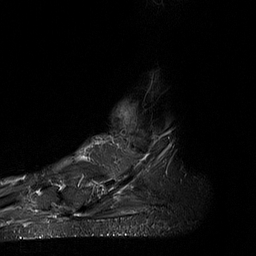
[im 9/22]
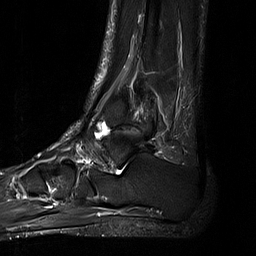
[im 13/22]
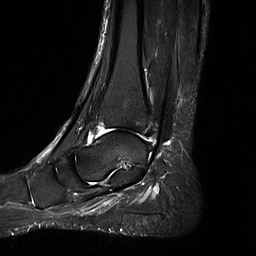
[im 17/22]
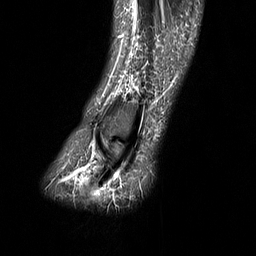
[im 22/22]
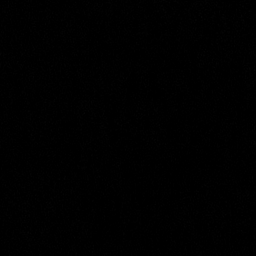

[40 of 40 positions shown; findings below may reference images not displayed]

FINDINGS: TENDONS

Peroneal: Intact and normally positioned.

Posteromedial: Intact and normally positioned. Trace sheath fluid,
within physiologic limits.

Anterior: Intact and normally positioned.

Achilles: Intact.

Plantar Fascia: Intact.

LIGAMENTS

Lateral: The anterior and posterior talofibular and calcaneofibular
ligaments are intact. The anterior and posterior tibiofibular
ligaments appear intact.

Medial: The deltoid ligament appears partially torn.

CARTILAGE AND BONES

Ankle Joint: There is a small ankle joint effusion. There are mild
tibiotalar degenerative changes with subchondral cyst formation
medially in the talar dome.

Subtalar Joints/Sinus Tarsi: Unremarkable.

Bones: There is a moderately displaced oblique fracture of the
distal fibula which appears incompletely healed. This extends over
4.7 cm in length and is associated with 6 mm of displacement. In
addition, there is a probable healing fracture of the tibial plafond
posteriorly with mild adjacent subchondral edema. There is mild
reactive edema within the medial malleolus and medial talus.

Other: Mild subcutaneous edema surrounding the ankle. No focal fluid
collection.
IMPRESSION: 1. Moderately displaced oblique fracture of the distal fibula
appears incompletely healed. Consider CT for further evaluation.
2. Probable healing fracture of the tibial plafond posteriorly. Mild
tibiotalar degenerative changes with subchondral cyst formation
medially in the talar dome.
3. Partial tear of the deltoid ligament.
4. The ankle tendons and additional ligaments appear intact.

## 2021-07-18 ENCOUNTER — Ambulatory Visit (INDEPENDENT_AMBULATORY_CARE_PROVIDER_SITE_OTHER): Payer: 59

## 2021-07-18 DIAGNOSIS — R2681 Unsteadiness on feet: Secondary | ICD-10-CM | POA: Diagnosis not present

## 2021-07-18 DIAGNOSIS — S93621D Sprain of tarsometatarsal ligament of right foot, subsequent encounter: Secondary | ICD-10-CM

## 2021-07-18 DIAGNOSIS — M76829 Posterior tibial tendinitis, unspecified leg: Secondary | ICD-10-CM

## 2021-07-18 DIAGNOSIS — M76822 Posterior tibial tendinitis, left leg: Secondary | ICD-10-CM | POA: Diagnosis not present

## 2021-07-18 NOTE — Progress Notes (Signed)
SITUATION ?Reason for Visit: Dispensation and Fitting of Custom Molded Gauntlet ?Patient Report: Patient reports comfort in ambulation and understands all instructions. ? ?OBJECTIVE DATA ?Patient History / Diagnosis:   ?  ICD-10-CM   ?1. Gait instability  R26.81   ?  ?2. PTTD (posterior tibial tendon dysfunction)  M76.829   ?  ?3. Sprain of tarsometatarsal joint of right foot, subsequent encounter  [id number]   ?  ?4. Posterior tibial tendonitis, left  M76.822   ?  ? ? ?Provided Device:  Dietitian Gauntlet: [employer] Standard ? ?Goals of Orthosis: ?- Improve gait ?- Decrease energy expenditure during the gait cycle ?- Improve balance ?- Stabilize motion at ankle and subtalar joint ?- Compensate for muscle weakness ?- Facilitate motion ?- Provide triplanar ankle and foot stabilization for weight bearing activities ? ?Device Justification: ?- Patient is ambulatory  ?- Device is medically necessary as part of the overall treatment due to the patient's condition and related symptoms ?- It is anticipated that the patient will benefit functionally with use of the device.  ?- The custom device is utilized in an attempt to avoid the need for surgery and because a prefabricated device is inappropriate. ? ?Upon gait analysis, the device appeared to be fitting well and the patient states that the device is comfortable. ? ?ACTIONS PERFORMED ?Patient was fit with Dance movement psychotherapist. Patient tolerated fitting procedure. Fit of the device is good. Patient was able to apply properly and ambulate without distress. Device function is to restrict and limit motion and provide stabilization in the ankle joint.  ? ?Goals and function of this device were explained in detail to the patient. The patient was shown how to properly apply, wear, and care for the device. It was explained that the device will fit and function best in an adjustable-closure shoe with a firm heel counter and a wide base of support. When the device  was dispensed, it was suitable for the patient's condition and not substandard. No guarantees were given. Precautions were reviewed.  ? ?Written instructions, warranty information, and a copy of DMEPOS Supplier Standards were provided. All questions answered and concerns addressed. ? ?PLAN ?Patient is to follow up in one week or as necessary (PRN). Plan of care was discussed with and agreed upon by patient. ? ? ?

## 2021-07-23 ENCOUNTER — Encounter: Payer: Self-pay | Admitting: Podiatry

## 2021-07-23 ENCOUNTER — Ambulatory Visit (INDEPENDENT_AMBULATORY_CARE_PROVIDER_SITE_OTHER): Payer: 59 | Admitting: Podiatry

## 2021-07-23 DIAGNOSIS — B351 Tinea unguium: Secondary | ICD-10-CM

## 2021-07-23 DIAGNOSIS — E1142 Type 2 diabetes mellitus with diabetic polyneuropathy: Secondary | ICD-10-CM | POA: Diagnosis not present

## 2021-07-23 DIAGNOSIS — E1169 Type 2 diabetes mellitus with other specified complication: Secondary | ICD-10-CM | POA: Diagnosis not present

## 2021-07-23 NOTE — Progress Notes (Signed)
?  Subjective:  ?Patient ID: Sarah Maldonado, female    DOB: Jul 19, 1970,   MRN: [id number] ? ?Chief Complaint  ?Patient presents with  ? Nail Problem  ?  The left big toenail is coming off and yellow color  ? ? ?51 y.o. female presents for concern of left great toe nail coming loose and falling off and getting discolored. Was seen in urgent care and told to soak and follow-up. No pain no drainage noted.  . Denies any other pedal complaints. Denies n/v/f/c.  ? ?Past Medical History:  ?Diagnosis Date  ? Abscess of left thigh 07/09/2018  ? Arthritis   ? Asthma   ? 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 Carpal tunnel syndrome 11/23/2008  ? Chronic pain syndrome   ? Confusion 06/06/2019  ? COVID-19 06/05/2019  ? Depression   ? Diabetes mellitus ([place])   ? DKA (diabetic ketoacidosis) ([place]) 06/05/2019  ? Emesis 06/06/2019  ? Essential hypertension 01/29/2018  ? GERD (gastroesophageal reflux disease)   ? Hyperlipidemia   ? Hypertension   ? Hypertriglyceridemia   ? Intervertebral disc disorder of lumbar region with myelopathy 11/23/2008  ? Mixed dyslipidemia 01/29/2018  ? Morbid obesity ([place]) 08/20/2018  ? OSA (obstructive sleep apnea)   ? Overweight 01/29/2018  ? Postoperative examination 07/09/2018  ? Syncope 01/29/2018  ? Type 2 diabetes mellitus with unspecified complications ([place]) 0000000  ? ? ?Objective:  ?Physical Exam: ?Vascular: DP/PT pulses 2/4 bilateral. CFT <3 seconds. Normal hair growth on digits. No edema.  ?Skin. No lacerations or abrasions bilateral feet. Left hallux nail thickened and loose to lateral border with sbungual debris ?Musculoskeletal: MMT 5/5 bilateral lower extremities in DF, PF, Inversion and Eversion. Deceased ROM in DF of ankle joint.  ?Neurological: Sensation intact to light touch.  ? ?Assessment:  ? ?1. Onychomycosis of multiple toenails with type 2 diabetes mellitus and peripheral neuropathy ([place])   ? ? ? ?Plan:  ?Patient was evaluated and treated and all questions answered. ?-Discussed and  educated patient on diabetic foot care, especially with  ?regards to the vascular, neurological and musculoskeletal systems.  ?-Stressed the importance of good glycemic control and the detriment of not  ?controlling glucose levels in relation to the foot. ?-Discussed supportive shoes at all times and checking feet regularly.  ?-Mechanically debrided hallux nail using sterile nail nipper and filed with dremel without incident as courtesy.  ?-Answered all patient questions ?-Patient to return  in 1 year for DM foot check  ?-Patient advised to call the office if any problems or questions arise in the meantime. ? ? ?Lorenda Peck, DPM  ? ? ?

## 2022-03-13 ENCOUNTER — Encounter: Payer: Self-pay | Admitting: Internal Medicine

## 2022-03-13 ENCOUNTER — Ambulatory Visit: Payer: 59 | Admitting: Internal Medicine

## 2022-03-13 VITALS — BP 128/84 | HR 117 | Temp 98.1°F | Resp 18 | Ht 66.0 in | Wt 207.0 lb

## 2022-03-13 DIAGNOSIS — E785 Hyperlipidemia, unspecified: Secondary | ICD-10-CM

## 2022-03-13 DIAGNOSIS — J4520 Mild intermittent asthma, uncomplicated: Secondary | ICD-10-CM

## 2022-03-13 DIAGNOSIS — E1169 Type 2 diabetes mellitus with other specified complication: Secondary | ICD-10-CM

## 2022-03-13 DIAGNOSIS — I10 Essential (primary) hypertension: Secondary | ICD-10-CM | POA: Diagnosis not present

## 2022-03-13 NOTE — Patient Instructions (Signed)
This is a transition of care visit.  She will bring the list of her medications.  I have reviewed her medication and our office have contacted her.

## 2022-03-13 NOTE — Progress Notes (Signed)
Office Visit  Subjective   Patient ID: Sarah Maldonado   DOB: [birth year]   Age: 51 y.o.   MRN: [id number]   Chief Complaint Chief Complaint  Patient presents with   Hospitalization Follow-up   Hypertension   Hyperglycemia     History of Present Illness 51 years old female who is here for follow-up from hospital discharge.  She was admitted to hospital on December 17 with a high sugar and treated for pneumonia also.  She has a history of asthma and treated for asthma exacerbation also.  She was started on steroid and discharged home on doxycycline, albuterol nebulizer treatment and tapering dose of prednisone.  Her blood sugar was in 300-400 range in the hospital.  She was discharged home and she says that her blood sugar is getting better but she has a history of noncompliance and uncontrolled diabetes.  Few months ago her hemoglobin A1c in our office was 13%.  He take Trulicity 4.5 mg q. weekly, Farxiga and Tresiba 200 units daily.  I just cannot believe that he take all of this medicine and still his blood sugar is high. She also has a chronic pain syndrome and is being followed at pain clinic.  She has a hyperlipidemia and is on Rosuvastatin 40 mg daily.  She has a lipid panel done on September 25 that shows LDL of 105 and triglyceride 596.  Her medication was adjusted and this need to be repeated.  She also has a hypertension and take blood pressure medication.  Past Medical History Past Medical History:  Diagnosis Date   Abscess of left thigh 07/09/2018   Arthritis    Asthma    Brachial neuritis 11/23/2008   Carpal tunnel syndrome 11/23/2008   Chronic pain syndrome    Confusion 06/06/2019   COVID-19 06/05/2019   Depression    Diabetes mellitus (HCC)    DKA (diabetic ketoacidosis) (HCC) 06/05/2019   Emesis 06/06/2019   Essential hypertension 01/29/2018   GERD (gastroesophageal reflux disease)    Hyperlipidemia    Hypertension    Hypertriglyceridemia    Intervertebral disc  disorder of lumbar region with myelopathy 11/23/2008   Mixed dyslipidemia 01/29/2018   Morbid obesity (HCC) 08/20/2018   OSA (obstructive sleep apnea)    Overweight 01/29/2018   Postoperative examination 07/09/2018   Syncope 01/29/2018   Type 2 diabetes mellitus with unspecified complications (HCC) 01/29/2018     Allergies Allergies  Allergen Reactions   Amoxicillin Hives   Contrast Media [Iodinated Contrast Media] Nausea And Vomiting   Latex    Morphine    Penicillins Hives     Review of Systems Review of Systems  Constitutional: Negative.   HENT: Negative.    Respiratory: Negative.    Cardiovascular: Negative.   Gastrointestinal: Negative.   Neurological: Negative.        Objective:    Vitals BP 128/84 (BP Location: Left Arm, Patient Position: Sitting)   Pulse (!) 117   Temp 98.1 F (36.7 C)   Resp 18   Ht 5\' 6"  (1.676 m)   Wt 207 lb (93.9 kg)   SpO2 96%   BMI 33.41 kg/m    Physical Examination Physical Exam Constitutional:      Appearance: Normal appearance. She is obese.  HENT:     Head: Normocephalic and atraumatic.  Eyes:     Extraocular Movements: Extraocular movements intact.     Pupils: Pupils are equal, round, and reactive to light.  Cardiovascular:  Rate and Rhythm: Normal rate and regular rhythm.     Heart sounds: Normal heart sounds.  Pulmonary:     Effort: Pulmonary effort is normal.     Breath sounds: Normal breath sounds.  Abdominal:     General: Bowel sounds are normal.     Palpations: Abdomen is soft.  Neurological:     General: No focal deficit present.     Mental Status: She is alert and oriented to person, place, and time.        Assessment & Plan:   Essential hypertension Controlled on current medications.   Asthma She will finish tapering dose of prednisone, antibiotic and will continue with DuoNeb nebulizer treatment.  Dyslipidemia associated with type 2 diabetes mellitus (HCC) She has uncontrolled diabetes and  uncontrolled lipid panel.  I have discussed with him about medication, and monitor her diet.  I will do lipid panel and A1c on next visit.    Return in about 1 month (around 04/13/2022).   Eloisa Northern, MD

## 2022-03-13 NOTE — Assessment & Plan Note (Signed)
She has uncontrolled diabetes and uncontrolled lipid panel.  I have discussed with him about medication, and monitor her diet.  I will do lipid panel and A1c on next visit.

## 2022-03-13 NOTE — Assessment & Plan Note (Signed)
Controlled on current medications 

## 2022-03-13 NOTE — Assessment & Plan Note (Signed)
She will finish tapering dose of prednisone, antibiotic and will continue with DuoNeb nebulizer treatment.

## 2022-03-15 ENCOUNTER — Other Ambulatory Visit: Payer: Self-pay

## 2022-03-15 MED ORDER — DEXCOM G6 TRANSMITTER MISC: 1.0000 | 5 refills | Status: AC

## 2022-03-26 ENCOUNTER — Other Ambulatory Visit: Payer: Self-pay

## 2022-03-26 MED ORDER — CLONIDINE HCL 0.3 MG PO TABS: 0.3000 mg | ORAL_TABLET | Freq: Three times a day (TID) | ORAL | 2 refills | Status: DC

## 2022-03-29 DIAGNOSIS — M549 Dorsalgia, unspecified: Secondary | ICD-10-CM | POA: Diagnosis not present

## 2022-03-29 DIAGNOSIS — G894 Chronic pain syndrome: Secondary | ICD-10-CM | POA: Diagnosis not present

## 2022-03-29 DIAGNOSIS — M4306 Spondylolysis, lumbar region: Secondary | ICD-10-CM | POA: Diagnosis not present

## 2022-04-04 ENCOUNTER — Other Ambulatory Visit: Payer: Self-pay

## 2022-04-12 ENCOUNTER — Ambulatory Visit: Payer: 59 | Admitting: Internal Medicine

## 2022-04-18 DIAGNOSIS — M25572 Pain in left ankle and joints of left foot: Secondary | ICD-10-CM | POA: Diagnosis not present

## 2022-04-18 DIAGNOSIS — M76822 Posterior tibial tendinitis, left leg: Secondary | ICD-10-CM | POA: Diagnosis not present

## 2022-04-18 DIAGNOSIS — G8929 Other chronic pain: Secondary | ICD-10-CM | POA: Diagnosis not present

## 2022-04-18 DIAGNOSIS — M2140 Flat foot [pes planus] (acquired), unspecified foot: Secondary | ICD-10-CM | POA: Diagnosis not present

## 2022-04-18 DIAGNOSIS — M069 Rheumatoid arthritis, unspecified: Secondary | ICD-10-CM | POA: Diagnosis not present

## 2022-04-19 DIAGNOSIS — M069 Rheumatoid arthritis, unspecified: Secondary | ICD-10-CM | POA: Diagnosis not present

## 2022-04-19 DIAGNOSIS — M459 Ankylosing spondylitis of unspecified sites in spine: Secondary | ICD-10-CM | POA: Diagnosis not present

## 2022-04-30 DIAGNOSIS — M549 Dorsalgia, unspecified: Secondary | ICD-10-CM | POA: Diagnosis not present

## 2022-04-30 DIAGNOSIS — G894 Chronic pain syndrome: Secondary | ICD-10-CM | POA: Diagnosis not present

## 2022-04-30 DIAGNOSIS — M4306 Spondylolysis, lumbar region: Secondary | ICD-10-CM | POA: Diagnosis not present

## 2022-05-22 DIAGNOSIS — M545 Low back pain, unspecified: Secondary | ICD-10-CM | POA: Diagnosis not present

## 2022-05-22 DIAGNOSIS — M4306 Spondylolysis, lumbar region: Secondary | ICD-10-CM | POA: Diagnosis not present

## 2022-05-27 ENCOUNTER — Other Ambulatory Visit: Payer: Self-pay

## 2022-05-27 DIAGNOSIS — M47816 Spondylosis without myelopathy or radiculopathy, lumbar region: Secondary | ICD-10-CM | POA: Diagnosis not present

## 2022-05-27 DIAGNOSIS — Z1389 Encounter for screening for other disorder: Secondary | ICD-10-CM | POA: Diagnosis not present

## 2022-05-27 DIAGNOSIS — M4306 Spondylolysis, lumbar region: Secondary | ICD-10-CM | POA: Diagnosis not present

## 2022-05-27 DIAGNOSIS — G894 Chronic pain syndrome: Secondary | ICD-10-CM | POA: Diagnosis not present

## 2022-05-27 DIAGNOSIS — M549 Dorsalgia, unspecified: Secondary | ICD-10-CM | POA: Diagnosis not present

## 2022-05-27 MED ORDER — HYDROCHLOROTHIAZIDE 25 MG PO TABS: 25.0000 mg | ORAL_TABLET | Freq: Every day | ORAL | 0 refills | Status: DC

## 2022-06-03 ENCOUNTER — Encounter: Payer: Self-pay | Admitting: Internal Medicine

## 2022-06-03 ENCOUNTER — Ambulatory Visit: Payer: 59 | Admitting: Internal Medicine

## 2022-06-03 VITALS — BP 130/80 | HR 112 | Temp 97.3°F | Resp 18 | Ht 66.0 in | Wt 215.4 lb

## 2022-06-03 DIAGNOSIS — I10 Essential (primary) hypertension: Secondary | ICD-10-CM

## 2022-06-03 DIAGNOSIS — E785 Hyperlipidemia, unspecified: Secondary | ICD-10-CM

## 2022-06-03 DIAGNOSIS — G4733 Obstructive sleep apnea (adult) (pediatric): Secondary | ICD-10-CM

## 2022-06-03 DIAGNOSIS — E1169 Type 2 diabetes mellitus with other specified complication: Secondary | ICD-10-CM

## 2022-06-03 DIAGNOSIS — R296 Repeated falls: Secondary | ICD-10-CM | POA: Diagnosis not present

## 2022-06-03 MED ORDER — DEXCOM G7 SENSOR MISC: 6 refills | Status: AC

## 2022-06-03 NOTE — Assessment & Plan Note (Signed)
She need evaluation and may need repeat sleep study.  I will refer her to see Dr. Alcide Clever.

## 2022-06-03 NOTE — Progress Notes (Signed)
Office Visit  Subjective   Patient ID: Sarah Maldonado   DOB: [birth year]   Age: 52 y.o.   MRN: [id number]   Chief Complaint Chief Complaint  Patient presents with   Follow-up    Follow up Essential hypertension      History of Present Illness 52 years old female who is here for follow-up. She says she has been falling down. Last time she fell was Sunday, she says her legs just give  away. She has numbness in her leg, her sugar has been uncontrolled with last HbA1c was 13 in September 2023. She is using CGM and says her sugar been ok. She is taking [place] 200 IU daily and history of noncompliance and uncontrolled diabetes.  Few months ago her hemoglobin A1c in our office was 13%.  He take Trulicity 4.5 mg q. weekly, Farxiga and Tresiba 200 units daily.  I just cannot believe that he take all of this medicine and still his blood sugar is high. She also has a chronic pain syndrome and is being followed at pain clinic.  She has a hyperlipidemia and is on Rosuvastatin 40 mg daily.  She has a lipid panel done on September 25 that shows LDL of 105 and triglyceride 596.  Her medication was adjusted and this need to be repeated.  She also has a hypertension and take blood pressure medication. She is using CPAP but feel like she need recalibrating.  Past Medical History Past Medical History:  Diagnosis Date   Abscess of left thigh 07/09/2018   Arthritis    Asthma    Brachial neuritis 11/23/2008   Carpal tunnel syndrome 11/23/2008   Chronic pain syndrome    Confusion 06/06/2019   COVID-19 06/05/2019   Depression    Diabetes mellitus ([place])    DKA (diabetic ketoacidosis) ([employer]) 06/05/2019   Emesis 06/06/2019   Essential hypertension 01/29/2018   GERD (gastroesophageal reflux disease)    Hyperlipidemia    Hypertension    Hypertriglyceridemia    Intervertebral disc disorder of lumbar region with myelopathy 11/23/2008   Mixed dyslipidemia 01/29/2018   Morbid obesity ([place]) 08/20/2018   OSA  (obstructive sleep apnea)    Overweight 01/29/2018   Postoperative examination 07/09/2018   Syncope 01/29/2018   Type 2 diabetes mellitus with unspecified complications ([place]) 0000000     Allergies Allergies  Allergen Reactions   Amoxicillin Hives   Contrast Media [Iodinated Contrast Media] Nausea And Vomiting   Latex    Morphine    Penicillins Hives     Review of Systems Review of Systems  Constitutional: Negative.   HENT: Negative.    Respiratory: Negative.    Cardiovascular: Negative.   Gastrointestinal: Negative.   Musculoskeletal:  Positive for back pain.  Neurological:  Positive for tingling and weakness.       Objective:    Vitals BP 130/80 (BP Location: Left Arm, Patient Position: Sitting, Cuff Size: Normal)   Pulse (!) 112   Temp (!) 97.3 F (36.3 C)   Resp 18   Ht '5\' 6"'$  (1.676 m)   Wt 215 lb 6 oz (97.7 kg)   SpO2 98%   BMI 34.76 kg/m    Physical Examination Physical Exam Constitutional:      Appearance: Normal appearance.  HENT:     Head: Normocephalic and atraumatic.  Eyes:     Extraocular Movements: Extraocular movements intact.     Pupils: Pupils are equal, round, and reactive to light.  Cardiovascular:  Rate and Rhythm: Normal rate and regular rhythm.     Heart sounds: Normal heart sounds.  Pulmonary:     Effort: Pulmonary effort is normal.     Breath sounds: Normal breath sounds.  Abdominal:     General: Bowel sounds are normal.     Palpations: Abdomen is soft.  Neurological:     General: No focal deficit present.     Mental Status: She is alert and oriented to person, place, and time.        Assessment & Plan:   Essential hypertension Blood pressure is better  OSA (obstructive sleep apnea) She need evaluation and may need repeat sleep study.  I will refer her to see Dr. Alcide Clever.  Dyslipidemia associated with type 2 diabetes mellitus (HCC) Her LDL was not controlled and sugar has been very high.  I will repeat HbA1c.      Return in about 1 month (around 07/04/2022).   Sarah Brothers, MD

## 2022-06-03 NOTE — Assessment & Plan Note (Signed)
Blood pressure is better

## 2022-06-03 NOTE — Assessment & Plan Note (Signed)
Her LDL was not controlled and sugar has been very high.  I will repeat HbA1c.

## 2022-06-04 ENCOUNTER — Telehealth: Payer: Self-pay | Admitting: Internal Medicine

## 2022-06-04 DIAGNOSIS — E1165 Type 2 diabetes mellitus with hyperglycemia: Secondary | ICD-10-CM

## 2022-06-04 LAB — CMP14 + ANION GAP
ALT: 29 IU/L (ref 0–32)
AST: 27 IU/L (ref 0–40)
Albumin/Globulin Ratio: 1.5 (ref 1.2–2.2)
Albumin: 4.1 g/dL (ref 3.8–4.9)
Alkaline Phosphatase: 91 IU/L (ref 44–121)
Anion Gap: 15 mmol/L (ref 10.0–18.0)
BUN/Creatinine Ratio: 15 (ref 9–23)
BUN: 15 mg/dL (ref 6–24)
Bilirubin Total: <0.2 mg/dL (ref 0.0–1.2)
CO2: 20 mmol/L (ref 20–29)
Calcium: 8.9 mg/dL (ref 8.7–10.2)
Chloride: 93 mmol/L — ABNORMAL LOW (ref 96–106)
Creatinine, Ser: 1.03 mg/dL — ABNORMAL HIGH (ref 0.57–1.00)
Globulin, Total: 2.8 g/dL (ref 1.5–4.5)
Glucose: 500 mg/dL — ABNORMAL HIGH (ref 70–99)
Potassium: 4.6 mmol/L (ref 3.5–5.2)
Sodium: 128 mmol/L — ABNORMAL LOW (ref 134–144)
Total Protein: 6.9 g/dL (ref 6.0–8.5)
eGFR: 65 mL/min/{1.73_m2} (ref >59)

## 2022-06-04 LAB — LIPID PANEL
Chol/HDL Ratio: 5.3 ratio — ABNORMAL HIGH (ref 0.0–4.4)
Cholesterol, Total: 197 mg/dL (ref 100–199)
HDL: 37 mg/dL — ABNORMAL LOW (ref >39)
LDL Chol Calc (NIH): 100 mg/dL — ABNORMAL HIGH (ref 0–99)
Triglycerides: 356 mg/dL — ABNORMAL HIGH (ref 0–149)
VLDL Cholesterol Cal: 60 mg/dL — ABNORMAL HIGH (ref 5–40)

## 2022-06-04 LAB — HEMOGLOBIN A1C
Est. average glucose Bld gHb Est-mCnc: 309 mg/dL
Hgb A1c MFr Bld: 12.4 % — ABNORMAL HIGH (ref 4.8–5.6)

## 2022-06-04 NOTE — Telephone Encounter (Signed)
Uncontrolled diabetes mellitus so will refer to endocrinology

## 2022-06-13 DIAGNOSIS — H40003 Preglaucoma, unspecified, bilateral: Secondary | ICD-10-CM | POA: Diagnosis not present

## 2022-06-20 DIAGNOSIS — G894 Chronic pain syndrome: Secondary | ICD-10-CM | POA: Diagnosis not present

## 2022-06-20 DIAGNOSIS — M549 Dorsalgia, unspecified: Secondary | ICD-10-CM | POA: Diagnosis not present

## 2022-06-20 DIAGNOSIS — M47816 Spondylosis without myelopathy or radiculopathy, lumbar region: Secondary | ICD-10-CM | POA: Diagnosis not present

## 2022-06-20 DIAGNOSIS — Z1389 Encounter for screening for other disorder: Secondary | ICD-10-CM | POA: Diagnosis not present

## 2022-06-24 ENCOUNTER — Telehealth: Payer: Self-pay

## 2022-06-24 NOTE — Telephone Encounter (Signed)
Received a phone call from [place] [facility] about pt's reading being 220/140 at last visit which was last week. They stated that pt has been having some stress at home and this could be the cause of elevated BP. Per Dr. Reesa Chew pt needs to come  in for a visit and bring all Bp meds. Called and LVM for pt to call back and schedule an appt wit Dr. Reesa Chew. Will continue trying to contact pt.

## 2022-07-01 ENCOUNTER — Other Ambulatory Visit: Payer: Self-pay

## 2022-07-01 MED ORDER — FENOFIBRATE 145 MG PO TABS: 145.0000 mg | ORAL_TABLET | Freq: Every day | ORAL | 0 refills | Status: DC

## 2022-07-01 MED ORDER — FARXIGA 10 MG PO TABS: 10.0000 mg | ORAL_TABLET | Freq: Every day | ORAL | 0 refills | Status: DC

## 2022-07-01 MED ORDER — ROSUVASTATIN CALCIUM 40 MG PO TABS: 40.0000 mg | ORAL_TABLET | Freq: Every day | ORAL | 0 refills | Status: DC

## 2022-07-03 ENCOUNTER — Ambulatory Visit: Payer: 59 | Admitting: Internal Medicine

## 2022-07-08 ENCOUNTER — Ambulatory Visit: Payer: 59 | Admitting: Internal Medicine

## 2022-07-12 ENCOUNTER — Encounter: Payer: Self-pay | Admitting: Internal Medicine

## 2022-07-12 ENCOUNTER — Ambulatory Visit: Payer: 59 | Admitting: Internal Medicine

## 2022-07-12 VITALS — BP 130/90 | HR 83 | Temp 97.0°F | Resp 18 | Ht 66.0 in | Wt 214.5 lb

## 2022-07-12 DIAGNOSIS — I10 Essential (primary) hypertension: Secondary | ICD-10-CM | POA: Diagnosis not present

## 2022-07-12 MED ORDER — IRBESARTAN 75 MG PO TABS: 75.0000 mg | ORAL_TABLET | Freq: Every day | ORAL | 11 refills | Status: DC

## 2022-07-12 NOTE — Assessment & Plan Note (Signed)
Blood pressure is not well controlled. 

## 2022-07-12 NOTE — Progress Notes (Addendum)
   Acute Office Visit  Subjective:     Patient ID: Sarah Maldonado, female    DOB: Aug 02, 1970, 52 y.o.   MRN: [id number]  Chief Complaint  Patient presents with   Follow-up    Essential Hypertension    HPI Patient is in today for uncontrolled hypertension. She has a lot of stress at home and compliance with medications is also an issue. She denies any chest pain, her blood pressure is better than what has been.  Review of Systems  Constitutional: Negative.   HENT: Negative.    Respiratory: Negative.    Cardiovascular: Negative.   Gastrointestinal: Negative.   Neurological: Negative.         Objective:    BP (!) 130/90 (BP Location: Left Arm, Patient Position: Sitting, Cuff Size: Normal)   Pulse 83   Temp (!) 97 F (36.1 C)   Resp 18   Ht 5\' 6"  (1.676 m)   Wt 214 lb 8 oz (97.3 kg)   SpO2 97%   BMI 34.62 kg/m    Physical Exam Constitutional:      Appearance: Normal appearance.  Cardiovascular:     Rate and Rhythm: Normal rate and regular rhythm.     Heart sounds: Normal heart sounds.  Pulmonary:     Effort: Pulmonary effort is normal.     Breath sounds: Normal breath sounds.  Neurological:     Mental Status: She is alert and oriented to person, place, and time.     No results found for any visits on 07/12/22.      Assessment & Plan:   Problem List Items Addressed This Visit       Cardiovascular and Mediastinum   Essential hypertension - Primary    Blood pressure is not well controlled.       Relevant Medications   irbesartan (AVAPRO) 75 MG tablet    No orders of the defined types were placed in this encounter.   Return in about 3 months (around 10/11/2022).  Eloisa Northern, MD

## 2022-07-18 DIAGNOSIS — M549 Dorsalgia, unspecified: Secondary | ICD-10-CM | POA: Diagnosis not present

## 2022-07-18 DIAGNOSIS — G894 Chronic pain syndrome: Secondary | ICD-10-CM | POA: Diagnosis not present

## 2022-07-18 DIAGNOSIS — Z1389 Encounter for screening for other disorder: Secondary | ICD-10-CM | POA: Diagnosis not present

## 2022-07-19 DIAGNOSIS — M6281 Muscle weakness (generalized): Secondary | ICD-10-CM | POA: Diagnosis not present

## 2022-07-19 DIAGNOSIS — M79605 Pain in left leg: Secondary | ICD-10-CM | POA: Diagnosis not present

## 2022-07-19 DIAGNOSIS — M5459 Other low back pain: Secondary | ICD-10-CM | POA: Diagnosis not present

## 2022-07-19 DIAGNOSIS — R2681 Unsteadiness on feet: Secondary | ICD-10-CM | POA: Diagnosis not present

## 2022-07-19 DIAGNOSIS — R296 Repeated falls: Secondary | ICD-10-CM | POA: Diagnosis not present

## 2022-07-25 DIAGNOSIS — J4540 Moderate persistent asthma, uncomplicated: Secondary | ICD-10-CM | POA: Diagnosis not present

## 2022-07-25 DIAGNOSIS — J301 Allergic rhinitis due to pollen: Secondary | ICD-10-CM | POA: Diagnosis not present

## 2022-07-25 DIAGNOSIS — G4733 Obstructive sleep apnea (adult) (pediatric): Secondary | ICD-10-CM | POA: Diagnosis not present

## 2022-07-25 DIAGNOSIS — R5383 Other fatigue: Secondary | ICD-10-CM | POA: Diagnosis not present

## 2022-07-26 DIAGNOSIS — R2681 Unsteadiness on feet: Secondary | ICD-10-CM | POA: Diagnosis not present

## 2022-07-26 DIAGNOSIS — R269 Unspecified abnormalities of gait and mobility: Secondary | ICD-10-CM | POA: Diagnosis not present

## 2022-07-26 DIAGNOSIS — R296 Repeated falls: Secondary | ICD-10-CM | POA: Diagnosis not present

## 2022-07-26 DIAGNOSIS — M5459 Other low back pain: Secondary | ICD-10-CM | POA: Diagnosis not present

## 2022-07-26 DIAGNOSIS — M6281 Muscle weakness (generalized): Secondary | ICD-10-CM | POA: Diagnosis not present

## 2022-07-29 ENCOUNTER — Ambulatory Visit: Payer: 59 | Admitting: Podiatry

## 2022-07-30 DIAGNOSIS — Z880 Allergy status to penicillin: Secondary | ICD-10-CM | POA: Diagnosis not present

## 2022-07-30 DIAGNOSIS — A419 Sepsis, unspecified organism: Secondary | ICD-10-CM | POA: Diagnosis not present

## 2022-07-30 DIAGNOSIS — Z79891 Long term (current) use of opiate analgesic: Secondary | ICD-10-CM | POA: Diagnosis not present

## 2022-07-30 DIAGNOSIS — E162 Hypoglycemia, unspecified: Secondary | ICD-10-CM | POA: Diagnosis not present

## 2022-07-30 DIAGNOSIS — Z452 Encounter for adjustment and management of vascular access device: Secondary | ICD-10-CM | POA: Diagnosis not present

## 2022-07-30 DIAGNOSIS — N390 Urinary tract infection, site not specified: Secondary | ICD-10-CM | POA: Diagnosis not present

## 2022-07-30 DIAGNOSIS — Z882 Allergy status to sulfonamides status: Secondary | ICD-10-CM | POA: Diagnosis not present

## 2022-07-30 DIAGNOSIS — Z885 Allergy status to narcotic agent status: Secondary | ICD-10-CM | POA: Diagnosis not present

## 2022-07-30 DIAGNOSIS — I252 Old myocardial infarction: Secondary | ICD-10-CM | POA: Diagnosis not present

## 2022-07-30 DIAGNOSIS — R112 Nausea with vomiting, unspecified: Secondary | ICD-10-CM | POA: Diagnosis not present

## 2022-07-30 DIAGNOSIS — Z91040 Latex allergy status: Secondary | ICD-10-CM | POA: Diagnosis not present

## 2022-07-30 DIAGNOSIS — E11649 Type 2 diabetes mellitus with hypoglycemia without coma: Secondary | ICD-10-CM | POA: Diagnosis not present

## 2022-07-30 DIAGNOSIS — R6520 Severe sepsis without septic shock: Secondary | ICD-10-CM | POA: Diagnosis not present

## 2022-07-30 DIAGNOSIS — Z7984 Long term (current) use of oral hypoglycemic drugs: Secondary | ICD-10-CM | POA: Diagnosis not present

## 2022-07-30 DIAGNOSIS — K3189 Other diseases of stomach and duodenum: Secondary | ICD-10-CM | POA: Diagnosis not present

## 2022-07-30 DIAGNOSIS — Z87442 Personal history of urinary calculi: Secondary | ICD-10-CM | POA: Diagnosis not present

## 2022-07-30 DIAGNOSIS — N179 Acute kidney failure, unspecified: Secondary | ICD-10-CM | POA: Diagnosis not present

## 2022-07-30 DIAGNOSIS — Z7985 Long-term (current) use of injectable non-insulin antidiabetic drugs: Secondary | ICD-10-CM | POA: Diagnosis not present

## 2022-07-30 DIAGNOSIS — Z792 Long term (current) use of antibiotics: Secondary | ICD-10-CM | POA: Diagnosis not present

## 2022-07-30 DIAGNOSIS — F32A Depression, unspecified: Secondary | ICD-10-CM | POA: Diagnosis not present

## 2022-07-30 DIAGNOSIS — E7800 Pure hypercholesterolemia, unspecified: Secondary | ICD-10-CM | POA: Diagnosis not present

## 2022-07-30 DIAGNOSIS — R42 Dizziness and giddiness: Secondary | ICD-10-CM | POA: Diagnosis not present

## 2022-07-30 DIAGNOSIS — R519 Headache, unspecified: Secondary | ICD-10-CM | POA: Diagnosis not present

## 2022-07-30 DIAGNOSIS — M1990 Unspecified osteoarthritis, unspecified site: Secondary | ICD-10-CM | POA: Diagnosis not present

## 2022-07-30 DIAGNOSIS — J449 Chronic obstructive pulmonary disease, unspecified: Secondary | ICD-10-CM | POA: Diagnosis not present

## 2022-07-30 DIAGNOSIS — N183 Chronic kidney disease, stage 3 unspecified: Secondary | ICD-10-CM | POA: Diagnosis not present

## 2022-07-30 DIAGNOSIS — R7401 Elevation of levels of liver transaminase levels: Secondary | ICD-10-CM | POA: Diagnosis not present

## 2022-07-30 DIAGNOSIS — A4181 Sepsis due to Enterococcus: Secondary | ICD-10-CM | POA: Diagnosis not present

## 2022-07-30 DIAGNOSIS — R000 Tachycardia, unspecified: Secondary | ICD-10-CM | POA: Diagnosis not present

## 2022-07-30 DIAGNOSIS — E1122 Type 2 diabetes mellitus with diabetic chronic kidney disease: Secondary | ICD-10-CM | POA: Diagnosis not present

## 2022-07-30 DIAGNOSIS — I129 Hypertensive chronic kidney disease with stage 1 through stage 4 chronic kidney disease, or unspecified chronic kidney disease: Secondary | ICD-10-CM | POA: Diagnosis not present

## 2022-07-30 DIAGNOSIS — M109 Gout, unspecified: Secondary | ICD-10-CM | POA: Diagnosis not present

## 2022-07-31 DIAGNOSIS — N390 Urinary tract infection, site not specified: Secondary | ICD-10-CM | POA: Diagnosis not present

## 2022-07-31 DIAGNOSIS — R000 Tachycardia, unspecified: Secondary | ICD-10-CM

## 2022-07-31 DIAGNOSIS — A4181 Sepsis due to Enterococcus: Secondary | ICD-10-CM | POA: Diagnosis not present

## 2022-07-31 DIAGNOSIS — N179 Acute kidney failure, unspecified: Secondary | ICD-10-CM | POA: Diagnosis not present

## 2022-08-01 ENCOUNTER — Ambulatory Visit: Payer: 59 | Admitting: Podiatry

## 2022-08-01 ENCOUNTER — Encounter (INDEPENDENT_AMBULATORY_CARE_PROVIDER_SITE_OTHER): Payer: 59 | Admitting: Podiatry

## 2022-08-01 DIAGNOSIS — N390 Urinary tract infection, site not specified: Secondary | ICD-10-CM | POA: Diagnosis not present

## 2022-08-01 DIAGNOSIS — A4181 Sepsis due to Enterococcus: Secondary | ICD-10-CM | POA: Diagnosis not present

## 2022-08-01 DIAGNOSIS — N179 Acute kidney failure, unspecified: Secondary | ICD-10-CM | POA: Diagnosis not present

## 2022-08-01 NOTE — Progress Notes (Signed)
Patient was a no show for visit today

## 2022-08-01 NOTE — Progress Notes (Signed)
This encounter was created in error - please disregard.

## 2022-08-13 DIAGNOSIS — G4733 Obstructive sleep apnea (adult) (pediatric): Secondary | ICD-10-CM | POA: Diagnosis not present

## 2022-08-22 DIAGNOSIS — G4761 Periodic limb movement disorder: Secondary | ICD-10-CM | POA: Diagnosis not present

## 2022-08-22 DIAGNOSIS — J301 Allergic rhinitis due to pollen: Secondary | ICD-10-CM | POA: Diagnosis not present

## 2022-08-22 DIAGNOSIS — J4540 Moderate persistent asthma, uncomplicated: Secondary | ICD-10-CM | POA: Diagnosis not present

## 2022-08-22 DIAGNOSIS — R5383 Other fatigue: Secondary | ICD-10-CM | POA: Diagnosis not present

## 2022-08-22 DIAGNOSIS — G4733 Obstructive sleep apnea (adult) (pediatric): Secondary | ICD-10-CM | POA: Diagnosis not present

## 2022-09-04 DIAGNOSIS — I10 Essential (primary) hypertension: Secondary | ICD-10-CM | POA: Diagnosis not present

## 2022-09-04 DIAGNOSIS — G4733 Obstructive sleep apnea (adult) (pediatric): Secondary | ICD-10-CM | POA: Diagnosis not present

## 2022-09-17 DIAGNOSIS — Z1389 Encounter for screening for other disorder: Secondary | ICD-10-CM | POA: Diagnosis not present

## 2022-09-17 DIAGNOSIS — M47816 Spondylosis without myelopathy or radiculopathy, lumbar region: Secondary | ICD-10-CM | POA: Diagnosis not present

## 2022-09-17 DIAGNOSIS — G894 Chronic pain syndrome: Secondary | ICD-10-CM | POA: Diagnosis not present

## 2022-09-17 DIAGNOSIS — M549 Dorsalgia, unspecified: Secondary | ICD-10-CM | POA: Diagnosis not present

## 2022-09-17 DIAGNOSIS — M4306 Spondylolysis, lumbar region: Secondary | ICD-10-CM | POA: Diagnosis not present

## 2022-10-04 DIAGNOSIS — G4733 Obstructive sleep apnea (adult) (pediatric): Secondary | ICD-10-CM | POA: Diagnosis not present

## 2022-10-11 ENCOUNTER — Ambulatory Visit: Payer: Medicare HMO | Admitting: Internal Medicine

## 2022-10-11 ENCOUNTER — Encounter: Payer: Self-pay | Admitting: Internal Medicine

## 2022-10-11 VITALS — BP 140/88 | HR 110 | Temp 97.3°F | Resp 18 | Ht 66.0 in | Wt 207.4 lb

## 2022-10-11 DIAGNOSIS — I10 Essential (primary) hypertension: Secondary | ICD-10-CM | POA: Diagnosis not present

## 2022-10-11 DIAGNOSIS — E1169 Type 2 diabetes mellitus with other specified complication: Secondary | ICD-10-CM | POA: Diagnosis not present

## 2022-10-11 DIAGNOSIS — Z6833 Body mass index (BMI) 33.0-33.9, adult: Secondary | ICD-10-CM | POA: Diagnosis not present

## 2022-10-11 DIAGNOSIS — E785 Hyperlipidemia, unspecified: Secondary | ICD-10-CM

## 2022-10-11 MED ORDER — GLIMEPIRIDE 4 MG PO TABS: 4.0000 mg | ORAL_TABLET | Freq: Every day | ORAL | 6 refills | Status: DC

## 2022-10-11 MED ORDER — METOPROLOL TARTRATE 100 MG PO TABS: 100.0000 mg | ORAL_TABLET | Freq: Two times a day (BID) | ORAL | 6 refills | Status: DC

## 2022-10-11 MED ORDER — DEXCOM G7 SENSOR MISC: 6 refills | Status: AC

## 2022-10-11 NOTE — Progress Notes (Unsigned)
   Office Visit  Subjective   Patient ID: Sarah Maldonado   DOB: February 28, 1971   Age: 52 y.o.   MRN: [id number]   Chief Complaint Chief Complaint  Patient presents with   Hypertension    Essential hypertension     History of Present Illness HPI   Past Medical History Past Medical History:  Diagnosis Date   Abscess of left thigh 07/09/2018   Arthritis    Asthma    Brachial neuritis 11/23/2008   Carpal tunnel syndrome 11/23/2008   Chronic pain syndrome    Confusion 06/06/2019   COVID-19 06/05/2019   Depression    Diabetes mellitus (HCC)    DKA (diabetic ketoacidosis) (HCC) 06/05/2019   Emesis 06/06/2019   Essential hypertension 01/29/2018   GERD (gastroesophageal reflux disease)    Hyperlipidemia    Hypertension    Hypertriglyceridemia    Intervertebral disc disorder of lumbar region with myelopathy 11/23/2008   Mixed dyslipidemia 01/29/2018   Morbid obesity (HCC) 08/20/2018   OSA (obstructive sleep apnea)    Overweight 01/29/2018   Postoperative examination 07/09/2018   Syncope 01/29/2018   Type 2 diabetes mellitus with unspecified complications (HCC) 01/29/2018     Allergies Allergies  Allergen Reactions   Amoxicillin Hives   Contrast Media [Iodinated Contrast Media] Nausea And Vomiting   Latex    Morphine    Penicillins Hives     Review of Systems ROS     Objective:    Vitals BP (!) 140/88 (BP Location: Left Arm, Patient Position: Sitting, Cuff Size: Normal)   Pulse (!) 110   Temp (!) 97.3 F (36.3 C)   Resp 18   Ht 5\' 6"  (1.676 m)   Wt 207 lb 6 oz (94.1 kg)   SpO2 93%   BMI 33.47 kg/m    Physical Examination Physical Exam     Assessment & Plan:   No problem-specific Assessment & Plan notes found for this encounter.    Return in about 2 months (around 12/12/2022).   Eloisa Northern, MD

## 2022-10-17 DIAGNOSIS — Z6833 Body mass index (BMI) 33.0-33.9, adult: Secondary | ICD-10-CM | POA: Insufficient documentation

## 2022-10-17 NOTE — Assessment & Plan Note (Signed)
She will continue to watch her diet and take her medications.

## 2022-10-17 NOTE — Assessment & Plan Note (Signed)
Better but not target controlled. I will send her refill of medications.

## 2022-10-17 NOTE — Assessment & Plan Note (Signed)
I will repeat labs as her numbers looks better.

## 2022-10-18 DIAGNOSIS — Z79891 Long term (current) use of opiate analgesic: Secondary | ICD-10-CM | POA: Diagnosis not present

## 2022-10-18 DIAGNOSIS — G894 Chronic pain syndrome: Secondary | ICD-10-CM | POA: Diagnosis not present

## 2022-10-18 DIAGNOSIS — M4306 Spondylolysis, lumbar region: Secondary | ICD-10-CM | POA: Diagnosis not present

## 2022-10-18 DIAGNOSIS — Z1389 Encounter for screening for other disorder: Secondary | ICD-10-CM | POA: Diagnosis not present

## 2022-10-18 DIAGNOSIS — M47816 Spondylosis without myelopathy or radiculopathy, lumbar region: Secondary | ICD-10-CM | POA: Diagnosis not present

## 2022-11-03 ENCOUNTER — Other Ambulatory Visit: Payer: Self-pay | Admitting: Internal Medicine

## 2022-11-04 DIAGNOSIS — G4733 Obstructive sleep apnea (adult) (pediatric): Secondary | ICD-10-CM | POA: Diagnosis not present

## 2022-11-18 DIAGNOSIS — M47816 Spondylosis without myelopathy or radiculopathy, lumbar region: Secondary | ICD-10-CM | POA: Diagnosis not present

## 2022-11-18 DIAGNOSIS — G894 Chronic pain syndrome: Secondary | ICD-10-CM | POA: Diagnosis not present

## 2022-11-18 DIAGNOSIS — Z1389 Encounter for screening for other disorder: Secondary | ICD-10-CM | POA: Diagnosis not present

## 2022-11-18 DIAGNOSIS — Z79891 Long term (current) use of opiate analgesic: Secondary | ICD-10-CM | POA: Diagnosis not present

## 2022-11-18 DIAGNOSIS — M4306 Spondylolysis, lumbar region: Secondary | ICD-10-CM | POA: Diagnosis not present

## 2022-12-13 ENCOUNTER — Ambulatory Visit: Payer: 59 | Admitting: Internal Medicine

## 2022-12-26 DIAGNOSIS — E113411 Type 2 diabetes mellitus with severe nonproliferative diabetic retinopathy with macular edema, right eye: Secondary | ICD-10-CM | POA: Diagnosis not present

## 2023-01-13 ENCOUNTER — Encounter: Payer: Self-pay | Admitting: Internal Medicine

## 2023-01-13 ENCOUNTER — Ambulatory Visit: Payer: Medicare HMO | Admitting: Internal Medicine

## 2023-01-13 VITALS — BP 140/84 | HR 88 | Temp 97.2°F | Resp 18 | Ht 66.0 in | Wt 215.0 lb

## 2023-01-13 DIAGNOSIS — E1169 Type 2 diabetes mellitus with other specified complication: Secondary | ICD-10-CM

## 2023-01-13 DIAGNOSIS — I10 Essential (primary) hypertension: Secondary | ICD-10-CM | POA: Diagnosis not present

## 2023-01-13 DIAGNOSIS — F3341 Major depressive disorder, recurrent, in partial remission: Secondary | ICD-10-CM | POA: Diagnosis not present

## 2023-01-13 DIAGNOSIS — E785 Hyperlipidemia, unspecified: Secondary | ICD-10-CM | POA: Diagnosis not present

## 2023-01-13 DIAGNOSIS — E782 Mixed hyperlipidemia: Secondary | ICD-10-CM | POA: Diagnosis not present

## 2023-01-13 MED ORDER — CARIPRAZINE HCL 1.5 MG PO CAPS: 1.5000 mg | ORAL_CAPSULE | Freq: Every day | ORAL | Status: DC

## 2023-01-13 MED ORDER — MONTELUKAST SODIUM 10 MG PO TABS: 10.0000 mg | ORAL_TABLET | Freq: Every day | ORAL | 6 refills | Status: DC

## 2023-01-13 MED ORDER — HYDRALAZINE HCL 100 MG PO TABS: 100.0000 mg | ORAL_TABLET | Freq: Two times a day (BID) | ORAL | 6 refills | Status: DC

## 2023-01-13 NOTE — Assessment & Plan Note (Signed)
She take duloxetine 60 mg daily and amitriptyline 75 mg daily. I will add raylar to better control her depression.

## 2023-01-13 NOTE — Progress Notes (Signed)
Office Visit  Subjective   Patient ID: Sarah Maldonado   DOB: [birth year]   Age: 52 y.o.   MRN: [id number]   Chief Complaint Chief Complaint  Patient presents with   Follow-up    Follow up     History of Present Illness 52 years old female is here or follow up. She says her blood pressure is better. She says she is taking her medications, I have added irbesartan on last visit to better control her blood pressure.    She also has diabetes mellitus. Her sugar was not controlled, but she says her sugar is better and she was bing eating because she is depress. She is due for her diabetes mellitus. She takes tresiba and Trulicity, she brought CGM and her blood sugar reading are better. Her last HbA1c was 12%. She was seen by [facility] and they have referred her to see [facility] associate. She saw them and they said every thing looks good.    Her cholesterol was also not target controlled, she is trying to watch her diet and take medications rosuvastatin and fenofibrate. Need to repeat labs.   She is losing weight by watching her diet and lost 7 more pounds since last visit. Her BMI is 33.   Past Medical History Past Medical History:  Diagnosis Date   Abscess of left thigh 07/09/2018   Arthritis    Asthma    Brachial neuritis 11/23/2008   Carpal tunnel syndrome 11/23/2008   Chronic pain syndrome    Confusion 06/06/2019   COVID-19 06/05/2019   Depression    Diabetes mellitus (HCC)    DKA (diabetic ketoacidosis) (HCC) 06/05/2019   Emesis 06/06/2019   Essential hypertension 01/29/2018   GERD (gastroesophageal reflux disease)    Hyperlipidemia    Hypertension    Hypertriglyceridemia    Intervertebral disc disorder of lumbar region with myelopathy 11/23/2008   Mixed dyslipidemia 01/29/2018   Morbid obesity (HCC) 08/20/2018   OSA (obstructive sleep apnea)    Overweight 01/29/2018   Postoperative examination 07/09/2018   Syncope 01/29/2018   Type 2 diabetes mellitus with  unspecified complications (HCC) 01/29/2018     Allergies Allergies  Allergen Reactions   Amoxicillin Hives   Contrast Media [Iodinated Contrast Media] Nausea And Vomiting   Latex    Morphine    Penicillins Hives     Review of Systems Review of Systems  Constitutional: Negative.   HENT: Negative.    Respiratory: Negative.    Cardiovascular: Negative.   Gastrointestinal: Negative.   Musculoskeletal:  Positive for back pain.  Neurological: Negative.   Psychiatric/Behavioral:  Positive for depression.        Objective:    Vitals BP (!) 140/84 (BP Location: Left Arm, Patient Position: Sitting, Cuff Size: Normal)   Pulse 88   Temp (!) 97.2 F (36.2 C)   Resp 18   Ht 5\' 6"  (1.676 m)   Wt 215 lb (97.5 kg)   SpO2 97%   BMI 34.70 kg/m    Physical Examination Physical Exam Constitutional:      Appearance: Normal appearance.  HENT:     Head: Normocephalic and atraumatic.  Cardiovascular:     Rate and Rhythm: Normal rate and regular rhythm.     Heart sounds: Normal heart sounds.  Pulmonary:     Effort: Pulmonary effort is normal.     Breath sounds: Normal breath sounds.  Abdominal:     General: Bowel sounds are normal.  Palpations: Abdomen is soft.  Neurological:     General: No focal deficit present.     Mental Status: She is alert and oriented to person, place, and time.        Assessment & Plan:   Depression She take duloxetine 60 mg daily and amitriptyline 75 mg daily. I will add raylar to better control her depression.   Dyslipidemia associated with type 2 diabetes mellitus (HCC) Her sugar and cholesterol was not controlled. I will do HbA1c and lipid panel today.    Return in about 3 months (around 04/15/2023).   Eloisa Northern, MD

## 2023-01-13 NOTE — Assessment & Plan Note (Signed)
Her sugar and cholesterol was not controlled. I will do HbA1c and lipid panel today.

## 2023-01-14 LAB — CMP14 + ANION GAP
ALT: 17 [IU]/L (ref 0–32)
AST: 18 [IU]/L (ref 0–40)
Albumin: 3.9 g/dL (ref 3.8–4.9)
Alkaline Phosphatase: 71 [IU]/L (ref 44–121)
Anion Gap: 15 mmol/L (ref 10.0–18.0)
BUN/Creatinine Ratio: 15 (ref 9–23)
BUN: 18 mg/dL (ref 6–24)
Bilirubin Total: 0.2 mg/dL (ref 0.0–1.2)
CO2: 21 mmol/L (ref 20–29)
Calcium: 9.3 mg/dL (ref 8.7–10.2)
Chloride: 99 mmol/L (ref 96–106)
Creatinine, Ser: 1.19 mg/dL — ABNORMAL HIGH (ref 0.57–1.00)
Globulin, Total: 3.3 g/dL (ref 1.5–4.5)
Glucose: 308 mg/dL — ABNORMAL HIGH (ref 70–99)
Potassium: 4.3 mmol/L (ref 3.5–5.2)
Sodium: 135 mmol/L (ref 134–144)
Total Protein: 7.2 g/dL (ref 6.0–8.5)
eGFR: 55 mL/min/{1.73_m2} — ABNORMAL LOW (ref >59)

## 2023-01-14 LAB — LIPID PANEL
Chol/HDL Ratio: 2.7 ratio (ref 0.0–4.4)
Cholesterol, Total: 100 mg/dL (ref 100–199)
HDL: 37 mg/dL — ABNORMAL LOW (ref >39)
LDL Chol Calc (NIH): 30 mg/dL (ref 0–99)
Triglycerides: 211 mg/dL — ABNORMAL HIGH (ref 0–149)
VLDL Cholesterol Cal: 33 mg/dL (ref 5–40)

## 2023-01-14 LAB — HEMOGLOBIN A1C
Est. average glucose Bld gHb Est-mCnc: 338 mg/dL
Hgb A1c MFr Bld: 13.4 % — ABNORMAL HIGH (ref 4.8–5.6)

## 2023-01-14 LAB — MICROALBUMIN / CREATININE URINE RATIO
Creatinine, Urine: 38.7 mg/dL
Microalb/Creat Ratio: 539 mg/g{creat} — ABNORMAL HIGH (ref 0–29)
Microalbumin, Urine: 208.5 ug/mL

## 2023-01-16 DIAGNOSIS — Z1389 Encounter for screening for other disorder: Secondary | ICD-10-CM | POA: Diagnosis not present

## 2023-01-16 DIAGNOSIS — M4306 Spondylolysis, lumbar region: Secondary | ICD-10-CM | POA: Diagnosis not present

## 2023-01-16 DIAGNOSIS — Z79891 Long term (current) use of opiate analgesic: Secondary | ICD-10-CM | POA: Diagnosis not present

## 2023-01-16 DIAGNOSIS — G894 Chronic pain syndrome: Secondary | ICD-10-CM | POA: Diagnosis not present

## 2023-01-16 DIAGNOSIS — M47816 Spondylosis without myelopathy or radiculopathy, lumbar region: Secondary | ICD-10-CM | POA: Diagnosis not present

## 2023-01-16 NOTE — Progress Notes (Signed)
Patient called.  Patient aware.  

## 2023-02-03 ENCOUNTER — Other Ambulatory Visit: Payer: Self-pay | Admitting: Internal Medicine

## 2023-02-03 ENCOUNTER — Other Ambulatory Visit: Payer: Self-pay

## 2023-02-03 MED ORDER — ESOMEPRAZOLE MAGNESIUM 40 MG PO CPDR: 40.0000 mg | DELAYED_RELEASE_CAPSULE | Freq: Every day | ORAL | 0 refills | Status: DC

## 2023-02-03 MED ORDER — TRULICITY 4.5 MG/0.5ML ~~LOC~~ SOAJ: 4.5000 mg | Freq: Every day | SUBCUTANEOUS | 0 refills | Status: DC

## 2023-02-10 ENCOUNTER — Other Ambulatory Visit: Payer: Self-pay

## 2023-02-10 MED ORDER — AMITRIPTYLINE HCL 75 MG PO TABS: 75.0000 mg | ORAL_TABLET | Freq: Every day | ORAL | 0 refills | Status: DC

## 2023-02-17 ENCOUNTER — Other Ambulatory Visit: Payer: Self-pay

## 2023-02-17 MED ORDER — GLIMEPIRIDE 4 MG PO TABS: 4.0000 mg | ORAL_TABLET | Freq: Every day | ORAL | 6 refills | Status: DC

## 2023-02-18 ENCOUNTER — Other Ambulatory Visit: Payer: Self-pay

## 2023-02-19 DIAGNOSIS — M549 Dorsalgia, unspecified: Secondary | ICD-10-CM | POA: Diagnosis not present

## 2023-02-19 DIAGNOSIS — Z1389 Encounter for screening for other disorder: Secondary | ICD-10-CM | POA: Diagnosis not present

## 2023-02-19 DIAGNOSIS — M4306 Spondylolysis, lumbar region: Secondary | ICD-10-CM | POA: Diagnosis not present

## 2023-02-19 DIAGNOSIS — G894 Chronic pain syndrome: Secondary | ICD-10-CM | POA: Diagnosis not present

## 2023-02-19 DIAGNOSIS — Z79891 Long term (current) use of opiate analgesic: Secondary | ICD-10-CM | POA: Diagnosis not present

## 2023-02-19 DIAGNOSIS — M47816 Spondylosis without myelopathy or radiculopathy, lumbar region: Secondary | ICD-10-CM | POA: Diagnosis not present

## 2023-02-22 DIAGNOSIS — H5711 Ocular pain, right eye: Secondary | ICD-10-CM | POA: Diagnosis not present

## 2023-02-22 DIAGNOSIS — R700 Elevated erythrocyte sedimentation rate: Secondary | ICD-10-CM | POA: Diagnosis not present

## 2023-02-22 DIAGNOSIS — E871 Hypo-osmolality and hyponatremia: Secondary | ICD-10-CM | POA: Diagnosis not present

## 2023-02-22 DIAGNOSIS — R739 Hyperglycemia, unspecified: Secondary | ICD-10-CM | POA: Diagnosis not present

## 2023-02-22 DIAGNOSIS — R519 Headache, unspecified: Secondary | ICD-10-CM | POA: Diagnosis not present

## 2023-02-22 DIAGNOSIS — H5710 Ocular pain, unspecified eye: Secondary | ICD-10-CM | POA: Diagnosis not present

## 2023-02-22 DIAGNOSIS — Z8739 Personal history of other diseases of the musculoskeletal system and connective tissue: Secondary | ICD-10-CM | POA: Diagnosis not present

## 2023-02-23 DIAGNOSIS — I10 Essential (primary) hypertension: Secondary | ICD-10-CM | POA: Diagnosis not present

## 2023-02-23 DIAGNOSIS — H538 Other visual disturbances: Secondary | ICD-10-CM | POA: Diagnosis not present

## 2023-02-23 DIAGNOSIS — Z8739 Personal history of other diseases of the musculoskeletal system and connective tissue: Secondary | ICD-10-CM | POA: Diagnosis not present

## 2023-02-23 DIAGNOSIS — R519 Headache, unspecified: Secondary | ICD-10-CM | POA: Diagnosis not present

## 2023-02-23 DIAGNOSIS — H5711 Ocular pain, right eye: Secondary | ICD-10-CM | POA: Diagnosis not present

## 2023-02-23 DIAGNOSIS — H01003 Unspecified blepharitis right eye, unspecified eyelid: Secondary | ICD-10-CM | POA: Diagnosis not present

## 2023-02-23 DIAGNOSIS — E871 Hypo-osmolality and hyponatremia: Secondary | ICD-10-CM | POA: Diagnosis not present

## 2023-02-23 DIAGNOSIS — H04123 Dry eye syndrome of bilateral lacrimal glands: Secondary | ICD-10-CM | POA: Diagnosis not present

## 2023-02-23 DIAGNOSIS — H5710 Ocular pain, unspecified eye: Secondary | ICD-10-CM | POA: Diagnosis not present

## 2023-02-23 DIAGNOSIS — E1136 Type 2 diabetes mellitus with diabetic cataract: Secondary | ICD-10-CM | POA: Diagnosis not present

## 2023-02-23 DIAGNOSIS — R700 Elevated erythrocyte sedimentation rate: Secondary | ICD-10-CM | POA: Diagnosis not present

## 2023-02-23 DIAGNOSIS — R739 Hyperglycemia, unspecified: Secondary | ICD-10-CM | POA: Diagnosis not present

## 2023-02-24 ENCOUNTER — Other Ambulatory Visit: Payer: Self-pay

## 2023-02-26 ENCOUNTER — Ambulatory Visit: Payer: Medicare HMO | Admitting: Internal Medicine

## 2023-02-27 DIAGNOSIS — J45909 Unspecified asthma, uncomplicated: Secondary | ICD-10-CM | POA: Diagnosis not present

## 2023-02-27 DIAGNOSIS — I10 Essential (primary) hypertension: Secondary | ICD-10-CM | POA: Diagnosis not present

## 2023-02-27 DIAGNOSIS — Z79891 Long term (current) use of opiate analgesic: Secondary | ICD-10-CM | POA: Diagnosis not present

## 2023-02-27 DIAGNOSIS — F321 Major depressive disorder, single episode, moderate: Secondary | ICD-10-CM | POA: Diagnosis not present

## 2023-02-27 DIAGNOSIS — Z794 Long term (current) use of insulin: Secondary | ICD-10-CM | POA: Diagnosis not present

## 2023-02-27 DIAGNOSIS — I951 Orthostatic hypotension: Secondary | ICD-10-CM | POA: Diagnosis not present

## 2023-02-27 DIAGNOSIS — E785 Hyperlipidemia, unspecified: Secondary | ICD-10-CM | POA: Diagnosis not present

## 2023-02-27 DIAGNOSIS — K219 Gastro-esophageal reflux disease without esophagitis: Secondary | ICD-10-CM | POA: Diagnosis not present

## 2023-02-27 DIAGNOSIS — E1142 Type 2 diabetes mellitus with diabetic polyneuropathy: Secondary | ICD-10-CM | POA: Diagnosis not present

## 2023-02-27 DIAGNOSIS — I252 Old myocardial infarction: Secondary | ICD-10-CM | POA: Diagnosis not present

## 2023-02-27 DIAGNOSIS — Z8249 Family history of ischemic heart disease and other diseases of the circulatory system: Secondary | ICD-10-CM | POA: Diagnosis not present

## 2023-03-03 ENCOUNTER — Ambulatory Visit: Payer: Medicare HMO | Admitting: Student

## 2023-03-07 DIAGNOSIS — I10 Essential (primary) hypertension: Secondary | ICD-10-CM | POA: Diagnosis not present

## 2023-03-07 DIAGNOSIS — H00014 Hordeolum externum left upper eyelid: Secondary | ICD-10-CM | POA: Diagnosis not present

## 2023-04-07 DIAGNOSIS — Z79891 Long term (current) use of opiate analgesic: Secondary | ICD-10-CM | POA: Diagnosis not present

## 2023-04-07 DIAGNOSIS — M4306 Spondylolysis, lumbar region: Secondary | ICD-10-CM | POA: Diagnosis not present

## 2023-04-07 DIAGNOSIS — M47816 Spondylosis without myelopathy or radiculopathy, lumbar region: Secondary | ICD-10-CM | POA: Diagnosis not present

## 2023-04-07 DIAGNOSIS — Z1389 Encounter for screening for other disorder: Secondary | ICD-10-CM | POA: Diagnosis not present

## 2023-04-07 DIAGNOSIS — G894 Chronic pain syndrome: Secondary | ICD-10-CM | POA: Diagnosis not present

## 2023-04-14 ENCOUNTER — Ambulatory Visit: Payer: Medicare HMO | Admitting: Internal Medicine

## 2023-04-14 VITALS — BP 138/84 | HR 138 | Temp 97.0°F | Resp 20 | Ht 66.0 in | Wt 215.2 lb

## 2023-04-14 DIAGNOSIS — E785 Hyperlipidemia, unspecified: Secondary | ICD-10-CM

## 2023-04-14 DIAGNOSIS — K5903 Drug induced constipation: Secondary | ICD-10-CM

## 2023-04-14 DIAGNOSIS — I10 Essential (primary) hypertension: Secondary | ICD-10-CM | POA: Diagnosis not present

## 2023-04-14 DIAGNOSIS — K5900 Constipation, unspecified: Secondary | ICD-10-CM | POA: Diagnosis not present

## 2023-04-14 DIAGNOSIS — E1169 Type 2 diabetes mellitus with other specified complication: Secondary | ICD-10-CM | POA: Diagnosis not present

## 2023-04-14 DIAGNOSIS — K5902 Outlet dysfunction constipation: Secondary | ICD-10-CM | POA: Diagnosis not present

## 2023-04-14 MED ORDER — NALOXEGOL OXALATE 25 MG PO TABS: 25.0000 mg | ORAL_TABLET | Freq: Every day | ORAL | 6 refills | Status: DC

## 2023-04-14 NOTE — Assessment & Plan Note (Signed)
Controlled.  

## 2023-04-14 NOTE — Assessment & Plan Note (Signed)
I will re-evaluate him after constipation is resolved and will refer him to see endocrinologist.

## 2023-04-14 NOTE — Progress Notes (Signed)
Office Visit  Subjective   Patient ID: Sarah Maldonado   DOB: [birth year]   Age: 53 y.o.   MRN: [id number]   Chief Complaint Chief Complaint  Patient presents with   Follow-up    3 month follow up     History of Present Illness   Sarah Maldonado is here for 3 month follow-up but she says she has constipation for last 1 week and now she is not even passing gas.  She has abdominal pain.  Because of her pain her heart rate was 138 and EKG shows normal sinus rhythm.  She has a history of chronic pain.  No nausea or vomiting.   She has uncontrolled diabetes mellitus and her hemoglobin A1c was 13 on last visit.  I have referred her to see endocrinologist but she has not seen anybody.  She has microalbuminuria noted on January 13, 2023.   She has hyperlipidemia her LDL was 30. she takes rosuvastatin 40 mg daily.    She has hypertension her blood pressure is okay.  Past Medical History Past Medical History:  Diagnosis Date   Abscess of left thigh 07/09/2018   Arthritis    Asthma    Brachial neuritis 11/23/2008   Carpal tunnel syndrome 11/23/2008   Chronic pain syndrome    Confusion 06/06/2019   COVID-19 06/05/2019   Depression    Diabetes mellitus (HCC)    DKA (diabetic ketoacidosis) (HCC) 06/05/2019   Emesis 06/06/2019   Essential hypertension 01/29/2018   GERD (gastroesophageal reflux disease)    Hyperlipidemia    Hypertension    Hypertriglyceridemia    Intervertebral disc disorder of lumbar region with myelopathy 11/23/2008   Mixed dyslipidemia 01/29/2018   Morbid obesity (HCC) 08/20/2018   OSA (obstructive sleep apnea)    Overweight 01/29/2018   Postoperative examination 07/09/2018   Syncope 01/29/2018   Type 2 diabetes mellitus with unspecified complications (HCC) 01/29/2018     Allergies Allergies  Allergen Reactions   Amoxicillin Hives   Contrast Media [Iodinated Contrast Media] Nausea And Vomiting   Latex    Morphine    Penicillins Hives     Review of  Systems Review of Systems  Constitutional: Negative.   Respiratory: Negative.    Cardiovascular: Negative.   Gastrointestinal:  Positive for abdominal pain and constipation.  Neurological: Negative.        Objective:    Vitals BP 138/84 (BP Location: Left Arm, Patient Position: Sitting, Cuff Size: Normal)   Pulse (!) 138   Temp (!) 97 F (36.1 C)   Resp 20   Ht 5\' 6"  (1.676 m)   Wt 215 lb 4 oz (97.6 kg)   SpO2 98%   BMI 34.74 kg/m    Physical Examination Physical Exam Constitutional:      Appearance: Normal appearance.  Cardiovascular:     Rate and Rhythm: Normal rate and regular rhythm.     Heart sounds: Normal heart sounds.  Pulmonary:     Effort: Pulmonary effort is normal.     Breath sounds: Normal breath sounds.  Abdominal:     General: Bowel sounds are normal.     Palpations: Abdomen is soft.  Neurological:     General: No focal deficit present.     Mental Status: She is alert and oriented to person, place, and time.        Assessment & Plan:   Hypertension   Controlled.  Drug-induced constipation I will do Abdomen xray and start him on Movantik if  xray is normal.  Dyslipidemia associated with type 2 diabetes mellitus (HCC) I will re-evaluate him after constipation is resolved and will refer him to see endocrinologist.    Return in about 1 week (around 04/21/2023).   Eloisa Northern, MD

## 2023-04-14 NOTE — Assessment & Plan Note (Signed)
I will do Abdomen xray and start him on Movantik if xray is normal.

## 2023-04-15 ENCOUNTER — Other Ambulatory Visit: Payer: Self-pay | Admitting: Internal Medicine

## 2023-04-15 ENCOUNTER — Other Ambulatory Visit: Payer: Self-pay

## 2023-04-15 MED ORDER — BISACODYL 10 MG RE SUPP: 10.0000 mg | Freq: Every day | RECTAL | 0 refills | Status: AC | PRN

## 2023-04-15 MED ORDER — METOPROLOL TARTRATE 100 MG PO TABS: 100.0000 mg | ORAL_TABLET | Freq: Two times a day (BID) | ORAL | 1 refills | Status: DC

## 2023-04-15 NOTE — Progress Notes (Signed)
I have spoken with her about her abdomen xray result that there is no obstruction. She will continue Movantik daily and take dulculax suppository as needed.

## 2023-04-16 ENCOUNTER — Other Ambulatory Visit: Payer: Self-pay

## 2023-04-16 MED ORDER — GLIMEPIRIDE 4 MG PO TABS: 4.0000 mg | ORAL_TABLET | Freq: Every day | ORAL | 6 refills | Status: DC

## 2023-04-21 ENCOUNTER — Encounter: Payer: Self-pay | Admitting: Internal Medicine

## 2023-04-22 ENCOUNTER — Other Ambulatory Visit: Payer: Self-pay | Admitting: Internal Medicine

## 2023-04-23 ENCOUNTER — Other Ambulatory Visit: Payer: Self-pay

## 2023-04-23 MED ORDER — GLIMEPIRIDE 4 MG PO TABS: 4.0000 mg | ORAL_TABLET | Freq: Every day | ORAL | 6 refills | Status: DC

## 2023-04-24 ENCOUNTER — Other Ambulatory Visit: Payer: Self-pay

## 2023-04-24 MED ORDER — GLIMEPIRIDE 4 MG PO TABS: 4.0000 mg | ORAL_TABLET | Freq: Every day | ORAL | 6 refills | Status: DC

## 2023-04-25 ENCOUNTER — Ambulatory Visit: Payer: Medicare HMO | Admitting: Internal Medicine

## 2023-04-25 ENCOUNTER — Encounter: Payer: Self-pay | Admitting: Internal Medicine

## 2023-04-25 VITALS — BP 140/90 | HR 106 | Temp 97.2°F | Resp 20 | Ht 66.0 in | Wt 213.1 lb

## 2023-04-25 DIAGNOSIS — E1169 Type 2 diabetes mellitus with other specified complication: Secondary | ICD-10-CM

## 2023-04-25 DIAGNOSIS — K5903 Drug induced constipation: Secondary | ICD-10-CM | POA: Diagnosis not present

## 2023-04-25 DIAGNOSIS — I10 Essential (primary) hypertension: Secondary | ICD-10-CM

## 2023-04-25 DIAGNOSIS — R112 Nausea with vomiting, unspecified: Secondary | ICD-10-CM

## 2023-04-25 DIAGNOSIS — E785 Hyperlipidemia, unspecified: Secondary | ICD-10-CM

## 2023-04-25 MED ORDER — TRULICITY 1.5 MG/0.5ML ~~LOC~~ SOAJ: 1.5000 mg | SUBCUTANEOUS | 6 refills | Status: AC

## 2023-04-25 MED ORDER — POLYETHYLENE GLYCOL 3350 17 G PO PACK: 17.0000 g | PACK | Freq: Every day | ORAL | 6 refills | Status: AC

## 2023-04-25 NOTE — Assessment & Plan Note (Signed)
This could be side effects of trulicty so I will decrease the dose of trulicity to 1.5 mg weekly.

## 2023-04-25 NOTE — Assessment & Plan Note (Signed)
I will add Miralax every other day.

## 2023-04-25 NOTE — Progress Notes (Unsigned)
   Office Visit  Subjective   Patient ID: Sarah Maldonado   DOB: 1970/06/20   Age: 53 y.o.   MRN: [id number]   Chief Complaint Chief Complaint  Patient presents with  . Rimary hypertension    1 week follow up     History of Present Illness 53 years old female is here complaining food stay in her stomach longer and then he through up. She has EGD 2 years ago for dysphagia. She says that her constipation is better with Movantik but she does not empty herself good either.   Past Medical History Past Medical History:  Diagnosis Date  . Abscess of left thigh 07/09/2018  . Arthritis   . Asthma   . 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 Carpal tunnel syndrome 11/23/2008  . Chronic pain syndrome   . Confusion 06/06/2019  . COVID-19 06/05/2019  . Depression   . Diabetes mellitus (HCC)   . DKA (diabetic ketoacidosis) (HCC) 06/05/2019  . Emesis 06/06/2019  . Essential hypertension 01/29/2018  . GERD (gastroesophageal reflux disease)   . Hyperlipidemia   . Hypertension   . Hypertriglyceridemia   . Intervertebral disc disorder of lumbar region with myelopathy 11/23/2008  . Mixed dyslipidemia 01/29/2018  . Morbid obesity (HCC) 08/20/2018  . OSA (obstructive sleep apnea)   . Overweight 01/29/2018  . Postoperative examination 07/09/2018  . Syncope 01/29/2018  . Type 2 diabetes mellitus with unspecified complications (HCC) 01/29/2018     Allergies Allergies  Allergen Reactions  . Amoxicillin Hives  . Contrast Media [Iodinated Contrast Media] Nausea And Vomiting  . Latex   . Morphine   . Penicillins Hives     Review of Systems Review of Systems  Constitutional: Negative.   Respiratory: Negative.    Cardiovascular: Negative.   Gastrointestinal:  Positive for nausea and vomiting.  Neurological: Negative.        Objective:    Vitals BP (!) 140/90 (BP Location: Left Arm, Patient Position: Sitting, Cuff Size: Normal)   Pulse (!) 106   Temp (!) 97.2 F (36.2 C)   Resp 20   Ht  5\' 6"  (1.676 m)   Wt 213 lb 2 oz (96.7 kg)   SpO2 99%   BMI 34.40 kg/m    Physical Examination Physical Exam Constitutional:      Appearance: Normal appearance. She is obese.  Cardiovascular:     Rate and Rhythm: Normal rate and regular rhythm.     Heart sounds: Normal heart sounds.  Pulmonary:     Effort: Pulmonary effort is normal.     Breath sounds: Normal breath sounds.  Abdominal:     General: Bowel sounds are normal.     Palpations: Abdomen is soft.  Neurological:     General: No focal deficit present.     Mental Status: She is alert and oriented to person, place, and time.       Assessment & Plan:   No problem-specific Assessment & Plan notes found for this encounter.    Return in about 1 month (around 05/23/2023).   Eloisa Northern, MD

## 2023-04-25 NOTE — Assessment & Plan Note (Signed)
Her pulse is still above 100 and she take clonidine and take metoprolol 100 mg twice a day.

## 2023-04-28 NOTE — Assessment & Plan Note (Signed)
I believe she has a noncompliance as she takes glimepiride 4 mg daily, Farxiga 10 mg daily, Tresiba 100 mg daily and Trulicity 4.5 mg weekly that I have decreased to 1.5 today.  I have referred her to see 3 endocrinologist and every time she canceled the appointment and never rescheduled them so they will not accept her.  I cannot take care of her I will send her a letter that she need to establish care with somebody else.

## 2023-04-30 ENCOUNTER — Ambulatory Visit: Payer: Medicare HMO | Admitting: Internal Medicine

## 2023-05-05 ENCOUNTER — Encounter: Payer: Self-pay | Admitting: Internal Medicine

## 2023-05-06 DIAGNOSIS — Z79891 Long term (current) use of opiate analgesic: Secondary | ICD-10-CM | POA: Diagnosis not present

## 2023-05-06 DIAGNOSIS — G894 Chronic pain syndrome: Secondary | ICD-10-CM | POA: Diagnosis not present

## 2023-05-06 DIAGNOSIS — M4306 Spondylolysis, lumbar region: Secondary | ICD-10-CM | POA: Diagnosis not present

## 2023-05-06 DIAGNOSIS — Z1389 Encounter for screening for other disorder: Secondary | ICD-10-CM | POA: Diagnosis not present

## 2023-05-06 DIAGNOSIS — M47816 Spondylosis without myelopathy or radiculopathy, lumbar region: Secondary | ICD-10-CM | POA: Diagnosis not present

## 2023-05-17 DIAGNOSIS — E119 Type 2 diabetes mellitus without complications: Secondary | ICD-10-CM | POA: Diagnosis not present

## 2023-05-17 DIAGNOSIS — I10 Essential (primary) hypertension: Secondary | ICD-10-CM | POA: Diagnosis not present

## 2023-05-17 DIAGNOSIS — R000 Tachycardia, unspecified: Secondary | ICD-10-CM | POA: Diagnosis not present

## 2023-05-17 DIAGNOSIS — N390 Urinary tract infection, site not specified: Secondary | ICD-10-CM | POA: Diagnosis not present

## 2023-05-17 DIAGNOSIS — I499 Cardiac arrhythmia, unspecified: Secondary | ICD-10-CM | POA: Diagnosis not present

## 2023-05-17 DIAGNOSIS — R079 Chest pain, unspecified: Secondary | ICD-10-CM | POA: Diagnosis not present

## 2023-05-17 DIAGNOSIS — I510 Cardiac septal defect, acquired: Secondary | ICD-10-CM | POA: Diagnosis not present

## 2023-05-17 DIAGNOSIS — R6889 Other general symptoms and signs: Secondary | ICD-10-CM | POA: Diagnosis not present

## 2023-05-17 DIAGNOSIS — R112 Nausea with vomiting, unspecified: Secondary | ICD-10-CM | POA: Diagnosis not present

## 2023-05-17 DIAGNOSIS — R9431 Abnormal electrocardiogram [ECG] [EKG]: Secondary | ICD-10-CM | POA: Diagnosis not present

## 2023-05-17 DIAGNOSIS — B349 Viral infection, unspecified: Secondary | ICD-10-CM | POA: Diagnosis not present

## 2023-05-26 ENCOUNTER — Ambulatory Visit: Payer: Medicare HMO | Admitting: Internal Medicine

## 2023-05-26 ENCOUNTER — Encounter: Payer: Self-pay | Admitting: Internal Medicine

## 2023-05-26 VITALS — BP 184/90 | HR 77 | Temp 97.8°F | Resp 18 | Ht 66.0 in | Wt 216.0 lb

## 2023-05-26 DIAGNOSIS — I10 Essential (primary) hypertension: Secondary | ICD-10-CM

## 2023-05-26 DIAGNOSIS — E1169 Type 2 diabetes mellitus with other specified complication: Secondary | ICD-10-CM

## 2023-05-26 DIAGNOSIS — E785 Hyperlipidemia, unspecified: Secondary | ICD-10-CM

## 2023-05-26 MED ORDER — DULOXETINE HCL 60 MG PO CPEP: 60.0000 mg | ORAL_CAPSULE | Freq: Every day | ORAL | 6 refills | Status: DC

## 2023-05-26 MED ORDER — IRBESARTAN 150 MG PO TABS: 150.0000 mg | ORAL_TABLET | Freq: Every day | ORAL | 11 refills | Status: AC

## 2023-05-26 NOTE — Assessment & Plan Note (Addendum)
 Blood pressure is not controlled and I will increase irbesartan 150 mg daily.  She will come back in 1 week for blood pressure check  And CMP.  After clonidine 0.1 mg her blood pressure is 140/80.
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
## 2023-05-26 NOTE — Assessment & Plan Note (Signed)
 Her diabetes is uncontrolled due to compliance with medication.  I will discharge her from our practice because of noncompliance.  Her lipid panel is well controlled.

## 2023-06-03 DIAGNOSIS — Z1389 Encounter for screening for other disorder: Secondary | ICD-10-CM | POA: Diagnosis not present

## 2023-06-03 DIAGNOSIS — M47816 Spondylosis without myelopathy or radiculopathy, lumbar region: Secondary | ICD-10-CM | POA: Diagnosis not present

## 2023-06-03 DIAGNOSIS — M4306 Spondylolysis, lumbar region: Secondary | ICD-10-CM | POA: Diagnosis not present

## 2023-06-03 DIAGNOSIS — M549 Dorsalgia, unspecified: Secondary | ICD-10-CM | POA: Diagnosis not present

## 2023-06-03 DIAGNOSIS — Z79891 Long term (current) use of opiate analgesic: Secondary | ICD-10-CM | POA: Diagnosis not present

## 2023-06-03 DIAGNOSIS — G894 Chronic pain syndrome: Secondary | ICD-10-CM | POA: Diagnosis not present

## 2023-06-05 DIAGNOSIS — E113313 Type 2 diabetes mellitus with moderate nonproliferative diabetic retinopathy with macular edema, bilateral: Secondary | ICD-10-CM | POA: Diagnosis not present

## 2023-06-06 ENCOUNTER — Encounter: Payer: Self-pay | Admitting: Internal Medicine

## 2023-06-06 ENCOUNTER — Ambulatory Visit: Admitting: Internal Medicine

## 2023-06-06 VITALS — BP 180/102 | HR 101 | Temp 97.6°F | Resp 18 | Ht 65.0 in | Wt 214.6 lb

## 2023-06-06 DIAGNOSIS — I10 Essential (primary) hypertension: Secondary | ICD-10-CM

## 2023-06-06 MED ORDER — HYDRALAZINE HCL 100 MG PO TABS: 100.0000 mg | ORAL_TABLET | Freq: Three times a day (TID) | ORAL | 6 refills | Status: AC

## 2023-06-06 MED ORDER — CLONIDINE HCL 0.3 MG PO TABS: 0.3000 mg | ORAL_TABLET | Freq: Three times a day (TID) | ORAL | 2 refills | Status: DC

## 2023-06-06 MED ORDER — HYDROCHLOROTHIAZIDE 25 MG PO TABS: 25.0000 mg | ORAL_TABLET | Freq: Every day | ORAL | 0 refills | Status: DC

## 2023-06-06 NOTE — Assessment & Plan Note (Signed)
 Her Blood pressure is uncontrolled, She is confused about her medications. I will restart her hydrochlorothiazide 25 mg daily and increase hydralazine to 100 mg three time a day

## 2023-06-06 NOTE — Progress Notes (Addendum)
   Office Visit  Subjective   Patient ID: Sarah Maldonado   DOB: 10/17/1970   Age: 53 y.o.   MRN: [id number]   Chief Complaint Chief Complaint  Patient presents with   Follow-up     History of Present Illness 53 years old female who is here for acute visit.  She went to see dentist yesterday and she need teeth to be pulled but his her blood pressure was 172/123 and 194/132 in their office.  Procedure was cancer and she was referred to CS here.  Her blood pressure has been high and her sugar has been uncontrolled.  She has a noncompliance about her medications.  When I ask her what blood pressure medicine she takes she is confused about her medications.  She also says that she ran out of her hydrochlorothiazide.    She also feel depressed and I have suggested that she need to go to see [facility].  Past Medical History Past Medical History:  Diagnosis Date   Abscess of left thigh 07/09/2018   Arthritis    Asthma    Brachial neuritis 11/23/2008   Carpal tunnel syndrome 11/23/2008   Chronic pain syndrome    Confusion 06/06/2019   COVID-19 06/05/2019   Depression    Diabetes mellitus (HCC)    DKA (diabetic ketoacidosis) (HCC) 06/05/2019   Emesis 06/06/2019   Essential hypertension 01/29/2018   GERD (gastroesophageal reflux disease)    Hyperlipidemia    Hypertension    Hypertriglyceridemia    Intervertebral disc disorder of lumbar region with myelopathy 11/23/2008   Mixed dyslipidemia 01/29/2018   Morbid obesity (HCC) 08/20/2018   OSA (obstructive sleep apnea)    Overweight 01/29/2018   Postoperative examination 07/09/2018   Syncope 01/29/2018   Type 2 diabetes mellitus with unspecified complications (HCC) 01/29/2018     Allergies Allergies  Allergen Reactions   Amoxicillin Hives   Contrast Media [Iodinated Contrast Media] Nausea And Vomiting   Latex    Morphine    Penicillins Hives     Review of Systems Review of Systems  Constitutional: Negative.   Respiratory:  Negative.    Cardiovascular: Negative.   Psychiatric/Behavioral:  Positive for depression.        Objective:    Vitals BP (!) 180/102 (BP Location: Right Arm, Patient Position: Sitting)   Pulse (!) 101   Temp 97.6 F (36.4 C) (Temporal)   Resp 18   Ht 5\' 5"  (1.651 m)   Wt 214 lb 9.6 oz (97.3 kg)   SpO2 97%   BMI 35.71 kg/m    Physical Examination Physical Exam Constitutional:      Appearance: Normal appearance.  Cardiovascular:     Rate and Rhythm: Normal rate and regular rhythm.     Heart sounds: Normal heart sounds.  Pulmonary:     Effort: Pulmonary effort is normal.     Breath sounds: Normal breath sounds.  Neurological:     Mental Status: She is alert.        Assessment & Plan:   Essential hypertension Her Blood pressure is uncontrolled, She is confused about her medications. I will restart her hydrochlorothiazide 25 mg daily and increase hydralazine to 100 mg three time a day    Return in about 2 weeks (around 06/20/2023).   Eloisa Northern, MD

## 2023-06-20 ENCOUNTER — Encounter: Payer: Self-pay | Admitting: Internal Medicine

## 2023-06-20 ENCOUNTER — Ambulatory Visit: Admitting: Internal Medicine

## 2023-06-20 VITALS — BP 126/80 | HR 95 | Temp 97.1°F | Resp 18 | Wt 215.5 lb

## 2023-06-20 DIAGNOSIS — I10 Essential (primary) hypertension: Secondary | ICD-10-CM

## 2023-06-20 DIAGNOSIS — N189 Chronic kidney disease, unspecified: Secondary | ICD-10-CM

## 2023-06-20 DIAGNOSIS — N179 Acute kidney failure, unspecified: Secondary | ICD-10-CM | POA: Diagnosis not present

## 2023-06-20 DIAGNOSIS — E785 Hyperlipidemia, unspecified: Secondary | ICD-10-CM | POA: Diagnosis not present

## 2023-06-20 DIAGNOSIS — E1169 Type 2 diabetes mellitus with other specified complication: Secondary | ICD-10-CM

## 2023-06-20 NOTE — Assessment & Plan Note (Signed)
 Her GFR was 65 that make it CKD 2 but 5 month ago GFR was 55 with urine microalbuminuria of 531. She was started on Farxiga 10 mg daily and I will repeat urine analysis and renal function on next visit.

## 2023-06-20 NOTE — Assessment & Plan Note (Signed)
 Her BMI is 35 with underlying hypertension, hyperlipidemia and diabetes make it morbidly obese.

## 2023-06-20 NOTE — Assessment & Plan Note (Signed)
 Her LDL was target control on October 24 with Crestor 40 mg daily.  She does not have any side effect.  But her sugar has been high.

## 2023-06-20 NOTE — Assessment & Plan Note (Addendum)
 Her blood pressure is controlled. She I cleared for dental procedure a that will help to control her sugar also.

## 2023-06-20 NOTE — Progress Notes (Signed)
 Office Visit  Subjective   Patient ID: MAIZY DAVANZO   DOB: 01-18-1971   Age: 53 y.o.   MRN: [id number]   Chief Complaint Chief Complaint  Patient presents with   Essential  hypertension    2 week follow up     History of Present Illness 53 years old female who is here for follow-up of her hypertension.  Her dental procedure was cancer because her blood pressure was 172/123 with pulse rate of 110 and repeat blood pressure was 194/132 and on review of her medication she was not taking her medication as she was supposed to take.  Her blood pressure is much better today.  She denies any complaint.  She wanted to proceed with her dental procedure.    She says that her blood sugar was still high, she take Tresiba 100 units in the morning and 100 at night, glimepiride 4 mg daily, Jardiance and Trulicity once a week.  In spite of taking all his medication she does not check her blood sugar at home.  She says that when home health people is a come they check her blood pressure and last time was 300 mg/dL. Her hemoglobin A1c was 13 on October 21st.  I have asked her to bring all her medication and check her blood sugar twice a day regularly.  Our office will contact her weekly to see if she is taking her medication and what her blood  sugar been..  her GFR was 55  and will repeat renal function on next visit.     Her depression is better and she says that she is not as aggravated as she was before.  Past Medical History Past Medical History:  Diagnosis Date   Abscess of left thigh 07/09/2018   Arthritis    Asthma    Brachial neuritis 11/23/2008   Carpal tunnel syndrome 11/23/2008   Chronic pain syndrome    Confusion 06/06/2019   COVID-19 06/05/2019   Depression    Diabetes mellitus (HCC)    DKA (diabetic ketoacidosis) (HCC) 06/05/2019   Emesis 06/06/2019   Essential hypertension 01/29/2018   GERD (gastroesophageal reflux disease)    Hyperlipidemia    Hypertension     Hypertriglyceridemia    Intervertebral disc disorder of lumbar region with myelopathy 11/23/2008   Mixed dyslipidemia 01/29/2018   Morbid obesity (HCC) 08/20/2018   OSA (obstructive sleep apnea)    Overweight 01/29/2018   Postoperative examination 07/09/2018   Syncope 01/29/2018   Type 2 diabetes mellitus with unspecified complications (HCC) 01/29/2018     Allergies Allergies  Allergen Reactions   Amoxicillin Hives   Contrast Media [Iodinated Contrast Media] Nausea And Vomiting   Latex    Morphine    Penicillins Hives     Review of Systems Review of Systems  Constitutional: Negative.   Respiratory: Negative.    Cardiovascular: Negative.   Gastrointestinal: Negative.   Neurological: Negative.        Objective:    Vitals BP 126/80 (BP Location: Left Arm, Patient Position: Sitting, Cuff Size: Normal)   Pulse 95   Temp (!) 97.1 F (36.2 C)   Resp 18   Wt 215 lb 8 oz (97.8 kg)   SpO2 98%   BMI 35.86 kg/m    Physical Examination Physical Exam Constitutional:      Appearance: Normal appearance. She is obese.  HENT:     Head: Normocephalic and atraumatic.  Cardiovascular:     Rate and Rhythm: Normal rate and  regular rhythm.     Heart sounds: Normal heart sounds.  Pulmonary:     Effort: Pulmonary effort is normal.     Breath sounds: Normal breath sounds.  Abdominal:     General: Bowel sounds are normal.     Palpations: Abdomen is soft.  Neurological:     General: No focal deficit present.     Mental Status: She is alert and oriented to person, place, and time.        Assessment & Plan:   Essential hypertension   Her blood pressure is controlled. She I cleared for dental procedure a that will help to control her sugar also.   Dyslipidemia associated with type 2 diabetes mellitus (HCC)   Her LDL was target control on October 24 with Crestor 40 mg daily.  She does not have any side effect.  But her sugar has been high.  Acute kidney injury superimposed on  CKD (HCC)   Her GFR was 65 that make it CKD 2 but 5 month ago GFR was 55 with urine microalbuminuria of 531. She was started on Farxiga 10 mg daily and I will repeat urine analysis and renal function on next visit.  Morbid obesity (HCC)   Her BMI is 35 with underlying hypertension, hyperlipidemia and diabetes make it morbidly obese.    Return in about 2 months (around 08/20/2023).   Eloisa Northern, MD

## 2023-06-23 ENCOUNTER — Other Ambulatory Visit: Payer: Self-pay

## 2023-06-23 NOTE — Telephone Encounter (Signed)
 06/23/2023 Called pt. At 9:23 am about blood sugar she had not taken it yet patient. Patient called back at 10:20 am blood sugar was 245.

## 2023-06-25 ENCOUNTER — Other Ambulatory Visit: Payer: Self-pay

## 2023-06-26 ENCOUNTER — Other Ambulatory Visit: Payer: Self-pay

## 2023-06-30 ENCOUNTER — Other Ambulatory Visit: Payer: Self-pay

## 2023-06-30 DIAGNOSIS — M47816 Spondylosis without myelopathy or radiculopathy, lumbar region: Secondary | ICD-10-CM | POA: Diagnosis not present

## 2023-06-30 DIAGNOSIS — Z1389 Encounter for screening for other disorder: Secondary | ICD-10-CM | POA: Diagnosis not present

## 2023-06-30 DIAGNOSIS — Z79891 Long term (current) use of opiate analgesic: Secondary | ICD-10-CM | POA: Diagnosis not present

## 2023-06-30 DIAGNOSIS — M4306 Spondylolysis, lumbar region: Secondary | ICD-10-CM | POA: Diagnosis not present

## 2023-06-30 DIAGNOSIS — G894 Chronic pain syndrome: Secondary | ICD-10-CM | POA: Diagnosis not present

## 2023-07-07 ENCOUNTER — Other Ambulatory Visit: Payer: Self-pay | Admitting: Internal Medicine

## 2023-07-26 DIAGNOSIS — K5900 Constipation, unspecified: Secondary | ICD-10-CM | POA: Diagnosis not present

## 2023-07-26 DIAGNOSIS — Z833 Family history of diabetes mellitus: Secondary | ICD-10-CM | POA: Diagnosis not present

## 2023-07-26 DIAGNOSIS — E1142 Type 2 diabetes mellitus with diabetic polyneuropathy: Secondary | ICD-10-CM | POA: Diagnosis not present

## 2023-07-26 DIAGNOSIS — N1831 Chronic kidney disease, stage 3a: Secondary | ICD-10-CM | POA: Diagnosis not present

## 2023-07-26 DIAGNOSIS — E1136 Type 2 diabetes mellitus with diabetic cataract: Secondary | ICD-10-CM | POA: Diagnosis not present

## 2023-07-26 DIAGNOSIS — K219 Gastro-esophageal reflux disease without esophagitis: Secondary | ICD-10-CM | POA: Diagnosis not present

## 2023-07-26 DIAGNOSIS — Z008 Encounter for other general examination: Secondary | ICD-10-CM | POA: Diagnosis not present

## 2023-07-26 DIAGNOSIS — Z8249 Family history of ischemic heart disease and other diseases of the circulatory system: Secondary | ICD-10-CM | POA: Diagnosis not present

## 2023-07-26 DIAGNOSIS — E11311 Type 2 diabetes mellitus with unspecified diabetic retinopathy with macular edema: Secondary | ICD-10-CM | POA: Diagnosis not present

## 2023-07-26 DIAGNOSIS — E669 Obesity, unspecified: Secondary | ICD-10-CM | POA: Diagnosis not present

## 2023-07-26 DIAGNOSIS — E785 Hyperlipidemia, unspecified: Secondary | ICD-10-CM | POA: Diagnosis not present

## 2023-07-26 DIAGNOSIS — J4489 Other specified chronic obstructive pulmonary disease: Secondary | ICD-10-CM | POA: Diagnosis not present

## 2023-07-26 DIAGNOSIS — Z794 Long term (current) use of insulin: Secondary | ICD-10-CM | POA: Diagnosis not present

## 2023-08-07 DIAGNOSIS — G4733 Obstructive sleep apnea (adult) (pediatric): Secondary | ICD-10-CM | POA: Diagnosis not present

## 2023-08-07 DIAGNOSIS — E1169 Type 2 diabetes mellitus with other specified complication: Secondary | ICD-10-CM | POA: Diagnosis not present

## 2023-08-07 DIAGNOSIS — J45909 Unspecified asthma, uncomplicated: Secondary | ICD-10-CM | POA: Diagnosis not present

## 2023-08-07 DIAGNOSIS — G894 Chronic pain syndrome: Secondary | ICD-10-CM | POA: Diagnosis not present

## 2023-08-07 DIAGNOSIS — F32A Depression, unspecified: Secondary | ICD-10-CM | POA: Diagnosis not present

## 2023-08-07 DIAGNOSIS — E785 Hyperlipidemia, unspecified: Secondary | ICD-10-CM | POA: Diagnosis not present

## 2023-08-07 DIAGNOSIS — E119 Type 2 diabetes mellitus without complications: Secondary | ICD-10-CM | POA: Diagnosis not present

## 2023-08-07 DIAGNOSIS — I10 Essential (primary) hypertension: Secondary | ICD-10-CM | POA: Diagnosis not present

## 2023-08-07 DIAGNOSIS — M47816 Spondylosis without myelopathy or radiculopathy, lumbar region: Secondary | ICD-10-CM | POA: Diagnosis not present

## 2023-08-07 DIAGNOSIS — Z79891 Long term (current) use of opiate analgesic: Secondary | ICD-10-CM | POA: Diagnosis not present

## 2023-08-07 DIAGNOSIS — K219 Gastro-esophageal reflux disease without esophagitis: Secondary | ICD-10-CM | POA: Diagnosis not present

## 2023-08-14 ENCOUNTER — Other Ambulatory Visit: Payer: Self-pay | Admitting: Internal Medicine

## 2023-08-15 ENCOUNTER — Ambulatory Visit: Admitting: Internal Medicine

## 2023-08-15 ENCOUNTER — Encounter: Payer: Self-pay | Admitting: Internal Medicine

## 2023-08-15 VITALS — BP 132/80 | HR 91 | Temp 97.7°F | Resp 18 | Ht 65.0 in | Wt 212.4 lb

## 2023-08-15 DIAGNOSIS — E1169 Type 2 diabetes mellitus with other specified complication: Secondary | ICD-10-CM | POA: Diagnosis not present

## 2023-08-15 DIAGNOSIS — M5106 Intervertebral disc disorders with myelopathy, lumbar region: Secondary | ICD-10-CM

## 2023-08-15 DIAGNOSIS — F331 Major depressive disorder, recurrent, moderate: Secondary | ICD-10-CM

## 2023-08-15 DIAGNOSIS — Z0000 Encounter for general adult medical examination without abnormal findings: Secondary | ICD-10-CM | POA: Insufficient documentation

## 2023-08-15 DIAGNOSIS — I10 Essential (primary) hypertension: Secondary | ICD-10-CM

## 2023-08-15 DIAGNOSIS — J4520 Mild intermittent asthma, uncomplicated: Secondary | ICD-10-CM

## 2023-08-15 DIAGNOSIS — Z6835 Body mass index (BMI) 35.0-35.9, adult: Secondary | ICD-10-CM

## 2023-08-15 DIAGNOSIS — G4733 Obstructive sleep apnea (adult) (pediatric): Secondary | ICD-10-CM | POA: Diagnosis not present

## 2023-08-15 DIAGNOSIS — E782 Mixed hyperlipidemia: Secondary | ICD-10-CM

## 2023-08-15 DIAGNOSIS — K219 Gastro-esophageal reflux disease without esophagitis: Secondary | ICD-10-CM

## 2023-08-15 MED ORDER — QUETIAPINE FUMARATE 25 MG PO TABS: 25.0000 mg | ORAL_TABLET | Freq: Every day | ORAL | 3 refills | Status: AC

## 2023-08-15 NOTE — Progress Notes (Unsigned)
 Office Visit  Subjective   Patient ID: Sarah Maldonado   DOB: Aug 25, 1970   Age: 53 y.o.   MRN: [id number]   Chief Complaint Chief Complaint  Patient presents with   Annual Exam    Annual exam      History of Present Illness Sarah Maldonado is a 53 year old African American/Black female who presents for her annual health maintenance exam. She is due for the following health maintenance studies: mammogram and diabetic foot exam. This patient's past medical history Arthritis, Asthma, Chronic pain syndrome, Degenerative Disc Disease, Degenerative Joint Disease, Depression, Diabetes Mellitus, Type II, GERD, Headaches, Hyperlipidemia, Hypertension, hypertriglyceridemia, Obesity, and Obstructive Sleep Apnea.  She is married. She does not drink or smoke. She has declined Flu, PNA vaccines and has not had Covid vaccine. She says her tetanus booster was 5 years ago.   She had a normal pap smear in 2023. She had a normal mammogram on 06/14/2021 but she is did not have mammogram in 2024 or this year. She had a colonoscopy on 08/14/21 and will be due in 10 years. She had her last eye exam 2 months ago at [facility]. She has diabetic retinopathy bilaterally, cataracts and presbyopia. She has foot examination today and does not have sensation and does not have position sense in left foot. No wound or ulcer in her feet. She does not exercise. She admit that she is depress. She score 12 on PHQ-9 test today. She does not have any suicidal ideation. She takes Duloxetine  60 mg daily and Amitriptyline  75 mg daily. She does not sleep either. This patient feels that she is able to care for herself and her dependents. Predisposing factors include: life style stress of multiple roles. She denies a recent life crisis.   Sarah Maldonado 53 year old African American/Black female presents with chronic pain syndrome where she has pain in her back & neck. She has been following with pain mgt and they are treating with  Oxycodone 10-300mg  TID, and diclofenac 75mg .    The patient is a 53 year old African American/Black female, who presents for the follow-up evaluation of GERD. Since the last visit the patient is doing well. There are no new complaints. She continues with Esomeprazole  and this is effect.   Sarah Maldonado returns today for routine followup on her cholesterol. Overall, she states she is doing well and is without any complaints or problems at this time. She specifically denies chest pain, abdominal pain, nausea, diarrhea, and myalgias. She remains on dietary management as well as the following cholesterol lowering medications rosuvastatin  40 mg tablet. She is fasting in anticipation for labs today.   The patient is a 53 year old African American/Black female who presents for a follow-up evaluation of hypertension. The patient has not been checking her blood pressure at home. The patient's current medications include: cloNIDine  HCl 0.3 MG Oral Tablet, hydralazine  100 mg oral tablet, hydroCHLOROthiazide  25 MG Oral Tablet, and metoprolol  tartrate 100 mg oral tablet and Irbesartan  150 mg daily. The patient has been tolerating her medications well. The patient denies any lightheadness, chest pain, shortness of breath, and orthopnea. She reports there have been no other symptoms noted.   The patient is a 53 year old African American/Black female who also has uncontrolled diabetes Mellitus. Per her husband patient does not watch her diet. She remains on Farxiga  10 mg oral tablet, Humalog KwikPen Insulin  200 unit/mL (3 mL) subcutaneous insulin  pen, Horace Lye FlexTouch U-200 insulin  100 unit/mL (  3 mL) subcutaneous pen, and Trulicity 1.5 mg/0.5 mL subcutaneous pen injector. She is not walking as much as they would like. She specifically denies chest pain, shortness of breath, unexplained fatigue, unexplained abdominal pain, nausea or vomiting, and documented hypoglycemia. She has lost one of the pieces of her Dexcom machine  and has not been using it. She came in fasting today in anticipation of lab work. She has the following complications of diabetes: retinopathy, neuropathy and hyperlipidemia.      She has been following with Pulmonary for her sleep apnea and was not using CPAP so company have taken machine away from her home.     Her current BMI is 35.2. She does snot exercise and tries to watch her diet.     Past Medical History Past Medical History:  Diagnosis Date   Abscess of left thigh 07/09/2018   Arthritis    Asthma    Brachial neuritis 11/23/2008   Carpal tunnel syndrome 11/23/2008   Chronic pain syndrome    Confusion 06/06/2019   COVID-19 06/05/2019   Depression    Diabetes mellitus (HCC)    DKA (diabetic ketoacidosis) (HCC) 06/05/2019   Emesis 06/06/2019   Essential hypertension 01/29/2018   GERD (gastroesophageal reflux disease)    Hyperlipidemia    Hypertension    Hypertriglyceridemia    Intervertebral disc disorder of lumbar region with myelopathy 11/23/2008   Mixed dyslipidemia 01/29/2018   Morbid obesity (HCC) 08/20/2018   OSA (obstructive sleep apnea)    Overweight 01/29/2018   Postoperative examination 07/09/2018   Syncope 01/29/2018   Type 2 diabetes mellitus with unspecified complications (HCC) 01/29/2018     Allergies Allergies  Allergen Reactions   Amoxicillin Hives   Contrast Media [Iodinated Contrast Media] Nausea And Vomiting   Latex    Morphine     Penicillins Hives     Review of Systems Review of Systems  Constitutional: Negative.   HENT: Negative.    Eyes:  Positive for blurred vision.  Respiratory:  Positive for shortness of breath.   Cardiovascular: Negative.   Gastrointestinal: Negative.   Genitourinary: Negative.   Musculoskeletal:  Positive for back pain.  Neurological: Negative.   Psychiatric/Behavioral:  Positive for depression.        Objective:    Vitals BP 132/80   Pulse 91   Temp 97.7 F (36.5 C)   Resp 18   Ht 5\' 5"  (1.651  m)   Wt 212 lb 6 oz (96.3 kg)   SpO2 98%   BMI 35.34 kg/m    Physical Examination Physical Exam Constitutional:      Appearance: Normal appearance. She is obese.  HENT:     Head: Normocephalic and atraumatic.  Cardiovascular:     Rate and Rhythm: Normal rate and regular rhythm.     Heart sounds: Normal heart sounds.  Pulmonary:     Effort: Pulmonary effort is normal.     Breath sounds: Normal breath sounds.  Abdominal:     General: Bowel sounds are normal.     Palpations: Abdomen is soft.  Neurological:     General: No focal deficit present.     Mental Status: She is alert and oriented to person, place, and time.        Assessment & Plan:   No problem-specific Assessment & Plan notes found for this encounter.    Return in about 1 month (around 09/15/2023).   Tita Form, MD

## 2023-08-17 NOTE — Assessment & Plan Note (Signed)
 I have suggested to see Dr. Corita Diego for possible repeat sleep study.

## 2023-08-17 NOTE — Assessment & Plan Note (Signed)
 Stable on current inhalor.

## 2023-08-17 NOTE — Assessment & Plan Note (Signed)
BP is controlled on current medication.

## 2023-08-17 NOTE — Assessment & Plan Note (Signed)
 She will continue with omeprazole.

## 2023-08-18 NOTE — Assessment & Plan Note (Signed)
 I will schedule her mammogram, [facility]

## 2023-08-18 NOTE — Assessment & Plan Note (Signed)
 She is fasting for lipid panel.  I will do hemoglobin A1c and urine microalbumin urea.  Her sugar is uncontrolled and I have discussed with her about her diet.

## 2023-08-18 NOTE — Assessment & Plan Note (Signed)
 She is still depressed and that make it hard to discuss other options.  I will add Seroquel 25 mg at bedtime as urgent therapy for depression.

## 2023-08-18 NOTE — Assessment & Plan Note (Signed)
 Her BMI is 35.  With underlying hypertension, hyperlipidemia and diabetes make it morbidly obese.

## 2023-08-18 NOTE — Assessment & Plan Note (Signed)
 She will continue follow-up with pain clinic.

## 2023-09-10 DIAGNOSIS — G4733 Obstructive sleep apnea (adult) (pediatric): Secondary | ICD-10-CM | POA: Diagnosis not present

## 2023-09-10 DIAGNOSIS — G894 Chronic pain syndrome: Secondary | ICD-10-CM | POA: Diagnosis not present

## 2023-09-10 DIAGNOSIS — M47816 Spondylosis without myelopathy or radiculopathy, lumbar region: Secondary | ICD-10-CM | POA: Diagnosis not present

## 2023-09-10 DIAGNOSIS — I10 Essential (primary) hypertension: Secondary | ICD-10-CM | POA: Diagnosis not present

## 2023-09-10 DIAGNOSIS — Z79891 Long term (current) use of opiate analgesic: Secondary | ICD-10-CM | POA: Diagnosis not present

## 2023-09-10 DIAGNOSIS — M5106 Intervertebral disc disorders with myelopathy, lumbar region: Secondary | ICD-10-CM | POA: Diagnosis not present

## 2023-09-10 DIAGNOSIS — E119 Type 2 diabetes mellitus without complications: Secondary | ICD-10-CM | POA: Diagnosis not present

## 2023-09-10 DIAGNOSIS — J45909 Unspecified asthma, uncomplicated: Secondary | ICD-10-CM | POA: Diagnosis not present

## 2023-09-10 DIAGNOSIS — E785 Hyperlipidemia, unspecified: Secondary | ICD-10-CM | POA: Diagnosis not present

## 2023-09-10 DIAGNOSIS — K219 Gastro-esophageal reflux disease without esophagitis: Secondary | ICD-10-CM | POA: Diagnosis not present

## 2023-09-10 DIAGNOSIS — F32A Depression, unspecified: Secondary | ICD-10-CM | POA: Diagnosis not present

## 2023-09-19 ENCOUNTER — Ambulatory Visit: Admitting: Internal Medicine

## 2023-09-19 ENCOUNTER — Encounter: Payer: Self-pay | Admitting: Internal Medicine

## 2023-09-19 VITALS — BP 132/80 | HR 71 | Temp 97.0°F | Resp 18 | Ht 63.0 in | Wt 218.1 lb

## 2023-09-19 DIAGNOSIS — E1169 Type 2 diabetes mellitus with other specified complication: Secondary | ICD-10-CM

## 2023-09-19 DIAGNOSIS — I10 Essential (primary) hypertension: Secondary | ICD-10-CM

## 2023-09-19 DIAGNOSIS — R42 Dizziness and giddiness: Secondary | ICD-10-CM | POA: Insufficient documentation

## 2023-09-19 DIAGNOSIS — E785 Hyperlipidemia, unspecified: Secondary | ICD-10-CM

## 2023-09-19 NOTE — Assessment & Plan Note (Signed)
 I believe her sugar is not controlled and staying thirsty and peeing a lot due to uncontrolled diabetes.  I will do hemoglobin A1c and urine microalbumin urea today.

## 2023-09-19 NOTE — Assessment & Plan Note (Signed)
 Her blood pressure is controlled.  She does not have orthostatic hypotension in our office.  Will continue to monitor.

## 2023-09-19 NOTE — Progress Notes (Signed)
 Office Visit  Subjective   Patient ID: Sarah Maldonado   DOB: 11-17-1970   Age: 53 y.o.   MRN: [id number]   Chief Complaint Chief Complaint  Patient presents with   Asthma    Follow up     History of Present Illness 53 years old female who is here for follow-up.   She says that she is not feeling good.  Twice within last 1 week when she stood up she felt lightheaded and dizzy.  She admitted staying thirsty and urinating a lot.  She has uncontrolled diabetes mellitus.  Her husband tells me that she does not watch her diet. She take Tresiba 100 units in the morning and 100 at night, glimepiride  4 mg daily, Jardiance and Trulicity  once a week.     She also has uncontrolled hypertension but her blood pressure is 132/80 that is much better than what it has been. She denies any complaint.    Her depression is also not better.   She denies any suicidal ideation.    She also follows pain clinic for her chronic pain.  Past Medical History Past Medical History:  Diagnosis Date   Abscess of left thigh 07/09/2018   Arthritis    Asthma    Brachial neuritis 11/23/2008   Carpal tunnel syndrome 11/23/2008   Chronic pain syndrome    Confusion 06/06/2019   COVID-19 06/05/2019   Depression    Diabetes mellitus (HCC)    DKA (diabetic ketoacidosis) (HCC) 06/05/2019   Emesis 06/06/2019   Essential hypertension 01/29/2018   GERD (gastroesophageal reflux disease)    Hyperlipidemia    Hypertension    Hypertriglyceridemia    Intervertebral disc disorder of lumbar region with myelopathy 11/23/2008   Mixed dyslipidemia 01/29/2018   Morbid obesity (HCC) 08/20/2018   OSA (obstructive sleep apnea)    Overweight 01/29/2018   Postoperative examination 07/09/2018   Syncope 01/29/2018   Type 2 diabetes mellitus with unspecified complications (HCC) 01/29/2018     Allergies Allergies  Allergen Reactions   Amoxicillin Hives   Contrast Media [Iodinated Contrast Media] Nausea And Vomiting   Latex     Morphine     Penicillins Hives     Review of Systems Review of Systems  Constitutional: Negative.   Cardiovascular: Negative.   Gastrointestinal: Negative.   Musculoskeletal:  Positive for joint pain.  Neurological:  Positive for tingling and weakness.  Psychiatric/Behavioral:  Positive for depression.        Objective:    Vitals BP 132/80   Pulse 71   Temp (!) 97 F (36.1 C)   Resp 18   Ht 5' 3 (1.6 m)   Wt 218 lb 2 oz (98.9 kg)   SpO2 99%   BMI 38.64 kg/m    Physical Examination Physical Exam Constitutional:      Appearance: Normal appearance.  HENT:     Head: Normocephalic and atraumatic.   Cardiovascular:     Rate and Rhythm: Normal rate and regular rhythm.     Heart sounds: Normal heart sounds.  Pulmonary:     Effort: Pulmonary effort is normal.     Breath sounds: Normal breath sounds.  Abdominal:     Palpations: Abdomen is soft.   Neurological:     General: No focal deficit present.     Mental Status: She is alert and oriented to person, place, and time.        Assessment & Plan:   Essential hypertension   Her blood pressure is  controlled.  She does not have orthostatic hypotension in our office.  Will continue to monitor.  Dyslipidemia associated with type 2 diabetes mellitus (HCC)   I believe her sugar is not controlled and staying thirsty and peeing a lot due to uncontrolled diabetes.  I will do hemoglobin A1c and urine microalbumin urea today.  Dizziness   Probably due to dehydration.    Return in about 1 month (around 10/19/2023).   Roetta Dare, MD

## 2023-09-19 NOTE — Assessment & Plan Note (Signed)
Probably due to dehydration.

## 2023-09-20 LAB — MICROALBUMIN / CREATININE URINE RATIO
Creatinine, Urine: 53.2 mg/dL
Microalb/Creat Ratio: 4005 mg/g{creat} — ABNORMAL HIGH (ref 0–29)
Microalbumin, Urine: 2130.5 ug/mL

## 2023-09-29 ENCOUNTER — Ambulatory Visit: Payer: Self-pay

## 2023-09-30 DIAGNOSIS — E1169 Type 2 diabetes mellitus with other specified complication: Secondary | ICD-10-CM | POA: Diagnosis not present

## 2023-09-30 DIAGNOSIS — E785 Hyperlipidemia, unspecified: Secondary | ICD-10-CM | POA: Diagnosis not present

## 2023-10-01 LAB — CMP14 + ANION GAP
ALT: 20 IU/L (ref 0–32)
AST: 52 IU/L — ABNORMAL HIGH (ref 0–40)
Albumin: 3.6 g/dL — ABNORMAL LOW (ref 3.8–4.9)
Alkaline Phosphatase: 134 IU/L — ABNORMAL HIGH (ref 44–121)
Anion Gap: 19 mmol/L — ABNORMAL HIGH (ref 10.0–18.0)
BUN/Creatinine Ratio: 15 (ref 9–23)
BUN: 28 mg/dL — ABNORMAL HIGH (ref 6–24)
Bilirubin Total: <0.2 mg/dL (ref 0.0–1.2)
CO2: 20 mmol/L (ref 20–29)
Calcium: 9.1 mg/dL (ref 8.7–10.2)
Chloride: 91 mmol/L — ABNORMAL LOW (ref 96–106)
Creatinine, Ser: 1.92 mg/dL — ABNORMAL HIGH (ref 0.57–1.00)
Globulin, Total: 4.2 g/dL (ref 1.5–4.5)
Glucose: 427 mg/dL — ABNORMAL HIGH (ref 70–99)
Sodium: 130 mmol/L — ABNORMAL LOW (ref 134–144)
Total Protein: 7.8 g/dL (ref 6.0–8.5)
eGFR: 31 mL/min/1.73 — ABNORMAL LOW (ref >59)

## 2023-10-01 LAB — HEMOGLOBIN A1C

## 2023-10-01 LAB — LIPID PANEL
Chol/HDL Ratio: 7 ratio — ABNORMAL HIGH (ref 0.0–4.4)
Cholesterol, Total: 310 mg/dL — ABNORMAL HIGH (ref 100–199)
HDL: 44 mg/dL (ref >39)
LDL Chol Calc (NIH): 164 mg/dL — ABNORMAL HIGH (ref 0–99)
Triglycerides: 516 mg/dL — ABNORMAL HIGH (ref 0–149)
VLDL Cholesterol Cal: 102 mg/dL — ABNORMAL HIGH (ref 5–40)

## 2023-10-01 LAB — MICROALBUMIN / CREATININE URINE RATIO
Creatinine, Urine: 47.9 mg/dL
Microalb/Creat Ratio: 4578 mg/g{creat} — ABNORMAL HIGH (ref 0–29)
Microalbumin, Urine: 2193 ug/mL

## 2023-10-05 DIAGNOSIS — K838 Other specified diseases of biliary tract: Secondary | ICD-10-CM | POA: Diagnosis not present

## 2023-10-05 DIAGNOSIS — E8770 Fluid overload, unspecified: Secondary | ICD-10-CM | POA: Diagnosis not present

## 2023-10-05 DIAGNOSIS — N179 Acute kidney failure, unspecified: Secondary | ICD-10-CM | POA: Diagnosis not present

## 2023-10-05 DIAGNOSIS — J811 Chronic pulmonary edema: Secondary | ICD-10-CM | POA: Diagnosis not present

## 2023-10-05 DIAGNOSIS — R7401 Elevation of levels of liver transaminase levels: Secondary | ICD-10-CM | POA: Diagnosis not present

## 2023-10-06 ENCOUNTER — Ambulatory Visit: Payer: Self-pay

## 2023-10-06 DIAGNOSIS — Z9049 Acquired absence of other specified parts of digestive tract: Secondary | ICD-10-CM | POA: Diagnosis not present

## 2023-10-06 DIAGNOSIS — Z7985 Long-term (current) use of injectable non-insulin antidiabetic drugs: Secondary | ICD-10-CM | POA: Diagnosis not present

## 2023-10-06 DIAGNOSIS — R7989 Other specified abnormal findings of blood chemistry: Secondary | ICD-10-CM | POA: Diagnosis not present

## 2023-10-06 DIAGNOSIS — N179 Acute kidney failure, unspecified: Secondary | ICD-10-CM | POA: Diagnosis not present

## 2023-10-06 DIAGNOSIS — K838 Other specified diseases of biliary tract: Secondary | ICD-10-CM | POA: Diagnosis not present

## 2023-10-06 DIAGNOSIS — K859 Acute pancreatitis without necrosis or infection, unspecified: Secondary | ICD-10-CM | POA: Diagnosis not present

## 2023-10-06 DIAGNOSIS — I160 Hypertensive urgency: Secondary | ICD-10-CM | POA: Diagnosis not present

## 2023-10-06 DIAGNOSIS — A419 Sepsis, unspecified organism: Secondary | ICD-10-CM | POA: Diagnosis not present

## 2023-10-06 DIAGNOSIS — Z9081 Acquired absence of spleen: Secondary | ICD-10-CM | POA: Diagnosis not present

## 2023-10-06 DIAGNOSIS — F339 Major depressive disorder, recurrent, unspecified: Secondary | ICD-10-CM | POA: Diagnosis not present

## 2023-10-06 DIAGNOSIS — E1165 Type 2 diabetes mellitus with hyperglycemia: Secondary | ICD-10-CM | POA: Diagnosis not present

## 2023-10-06 DIAGNOSIS — R935 Abnormal findings on diagnostic imaging of other abdominal regions, including retroperitoneum: Secondary | ICD-10-CM | POA: Diagnosis not present

## 2023-10-06 DIAGNOSIS — I10 Essential (primary) hypertension: Secondary | ICD-10-CM | POA: Diagnosis not present

## 2023-10-06 DIAGNOSIS — K219 Gastro-esophageal reflux disease without esophagitis: Secondary | ICD-10-CM | POA: Diagnosis not present

## 2023-10-06 DIAGNOSIS — Z87761 Personal history of (corrected) gastroschisis: Secondary | ICD-10-CM | POA: Diagnosis not present

## 2023-10-06 DIAGNOSIS — Z794 Long term (current) use of insulin: Secondary | ICD-10-CM | POA: Diagnosis not present

## 2023-10-06 DIAGNOSIS — R1310 Dysphagia, unspecified: Secondary | ICD-10-CM | POA: Diagnosis not present

## 2023-10-06 DIAGNOSIS — N390 Urinary tract infection, site not specified: Secondary | ICD-10-CM | POA: Diagnosis not present

## 2023-10-06 DIAGNOSIS — G4733 Obstructive sleep apnea (adult) (pediatric): Secondary | ICD-10-CM | POA: Diagnosis not present

## 2023-10-06 DIAGNOSIS — R070 Pain in throat: Secondary | ICD-10-CM | POA: Diagnosis not present

## 2023-10-06 DIAGNOSIS — J45909 Unspecified asthma, uncomplicated: Secondary | ICD-10-CM | POA: Diagnosis not present

## 2023-10-06 DIAGNOSIS — Z91041 Radiographic dye allergy status: Secondary | ICD-10-CM | POA: Diagnosis not present

## 2023-10-06 DIAGNOSIS — E785 Hyperlipidemia, unspecified: Secondary | ICD-10-CM | POA: Diagnosis not present

## 2023-10-08 NOTE — Progress Notes (Signed)
 Patient called.  Patient aware. Patient is Hospitalized.

## 2023-10-12 DIAGNOSIS — Z136 Encounter for screening for cardiovascular disorders: Secondary | ICD-10-CM | POA: Diagnosis not present

## 2023-10-13 DIAGNOSIS — G5600 Carpal tunnel syndrome, unspecified upper limb: Secondary | ICD-10-CM | POA: Diagnosis not present

## 2023-10-13 DIAGNOSIS — F32A Depression, unspecified: Secondary | ICD-10-CM | POA: Diagnosis not present

## 2023-10-13 DIAGNOSIS — E119 Type 2 diabetes mellitus without complications: Secondary | ICD-10-CM | POA: Diagnosis not present

## 2023-10-13 DIAGNOSIS — J45909 Unspecified asthma, uncomplicated: Secondary | ICD-10-CM | POA: Diagnosis not present

## 2023-10-13 DIAGNOSIS — I491 Atrial premature depolarization: Secondary | ICD-10-CM | POA: Diagnosis not present

## 2023-10-13 DIAGNOSIS — R000 Tachycardia, unspecified: Secondary | ICD-10-CM | POA: Diagnosis not present

## 2023-10-13 DIAGNOSIS — M5106 Intervertebral disc disorders with myelopathy, lumbar region: Secondary | ICD-10-CM | POA: Diagnosis not present

## 2023-10-13 DIAGNOSIS — K219 Gastro-esophageal reflux disease without esophagitis: Secondary | ICD-10-CM | POA: Diagnosis not present

## 2023-10-13 DIAGNOSIS — Z79891 Long term (current) use of opiate analgesic: Secondary | ICD-10-CM | POA: Diagnosis not present

## 2023-10-13 DIAGNOSIS — M47816 Spondylosis without myelopathy or radiculopathy, lumbar region: Secondary | ICD-10-CM | POA: Diagnosis not present

## 2023-10-13 DIAGNOSIS — E1169 Type 2 diabetes mellitus with other specified complication: Secondary | ICD-10-CM | POA: Diagnosis not present

## 2023-10-13 DIAGNOSIS — E785 Hyperlipidemia, unspecified: Secondary | ICD-10-CM | POA: Diagnosis not present

## 2023-10-13 DIAGNOSIS — I10 Essential (primary) hypertension: Secondary | ICD-10-CM | POA: Diagnosis not present

## 2023-10-17 ENCOUNTER — Inpatient Hospital Stay: Admitting: Internal Medicine

## 2023-11-18 DIAGNOSIS — E119 Type 2 diabetes mellitus without complications: Secondary | ICD-10-CM | POA: Diagnosis not present

## 2023-11-18 DIAGNOSIS — J45909 Unspecified asthma, uncomplicated: Secondary | ICD-10-CM | POA: Diagnosis not present

## 2023-11-18 DIAGNOSIS — I10 Essential (primary) hypertension: Secondary | ICD-10-CM | POA: Diagnosis not present

## 2023-11-18 DIAGNOSIS — E1169 Type 2 diabetes mellitus with other specified complication: Secondary | ICD-10-CM | POA: Diagnosis not present

## 2023-11-18 DIAGNOSIS — K219 Gastro-esophageal reflux disease without esophagitis: Secondary | ICD-10-CM | POA: Diagnosis not present

## 2023-11-18 DIAGNOSIS — M5106 Intervertebral disc disorders with myelopathy, lumbar region: Secondary | ICD-10-CM | POA: Diagnosis not present

## 2023-11-18 DIAGNOSIS — M47816 Spondylosis without myelopathy or radiculopathy, lumbar region: Secondary | ICD-10-CM | POA: Diagnosis not present

## 2023-11-18 DIAGNOSIS — E785 Hyperlipidemia, unspecified: Secondary | ICD-10-CM | POA: Diagnosis not present

## 2023-11-18 DIAGNOSIS — F32A Depression, unspecified: Secondary | ICD-10-CM | POA: Diagnosis not present

## 2023-11-18 DIAGNOSIS — Z79891 Long term (current) use of opiate analgesic: Secondary | ICD-10-CM | POA: Diagnosis not present

## 2023-11-29 ENCOUNTER — Other Ambulatory Visit: Payer: Self-pay | Admitting: Internal Medicine

## 2023-12-13 DIAGNOSIS — G9349 Other encephalopathy: Secondary | ICD-10-CM | POA: Diagnosis not present

## 2023-12-13 DIAGNOSIS — I160 Hypertensive urgency: Secondary | ICD-10-CM | POA: Diagnosis not present

## 2023-12-13 DIAGNOSIS — R410 Disorientation, unspecified: Secondary | ICD-10-CM | POA: Diagnosis not present

## 2023-12-13 DIAGNOSIS — R739 Hyperglycemia, unspecified: Secondary | ICD-10-CM | POA: Diagnosis not present

## 2023-12-13 DIAGNOSIS — E559 Vitamin D deficiency, unspecified: Secondary | ICD-10-CM | POA: Diagnosis not present

## 2023-12-13 DIAGNOSIS — N179 Acute kidney failure, unspecified: Secondary | ICD-10-CM | POA: Diagnosis not present

## 2023-12-13 DIAGNOSIS — I510 Cardiac septal defect, acquired: Secondary | ICD-10-CM | POA: Diagnosis not present

## 2023-12-13 DIAGNOSIS — N390 Urinary tract infection, site not specified: Secondary | ICD-10-CM | POA: Diagnosis not present

## 2023-12-13 DIAGNOSIS — R4182 Altered mental status, unspecified: Secondary | ICD-10-CM | POA: Diagnosis not present

## 2023-12-13 DIAGNOSIS — R000 Tachycardia, unspecified: Secondary | ICD-10-CM | POA: Diagnosis not present

## 2023-12-13 DIAGNOSIS — Z743 Need for continuous supervision: Secondary | ICD-10-CM | POA: Diagnosis not present

## 2023-12-13 DIAGNOSIS — R9431 Abnormal electrocardiogram [ECG] [EKG]: Secondary | ICD-10-CM | POA: Diagnosis not present

## 2023-12-13 DIAGNOSIS — I674 Hypertensive encephalopathy: Secondary | ICD-10-CM | POA: Diagnosis not present

## 2023-12-13 DIAGNOSIS — E1165 Type 2 diabetes mellitus with hyperglycemia: Secondary | ICD-10-CM | POA: Diagnosis not present

## 2023-12-13 DIAGNOSIS — E871 Hypo-osmolality and hyponatremia: Secondary | ICD-10-CM | POA: Diagnosis not present

## 2023-12-14 DIAGNOSIS — E876 Hypokalemia: Secondary | ICD-10-CM | POA: Diagnosis not present

## 2023-12-14 DIAGNOSIS — R29700 NIHSS score 0: Secondary | ICD-10-CM | POA: Diagnosis not present

## 2023-12-14 DIAGNOSIS — N179 Acute kidney failure, unspecified: Secondary | ICD-10-CM | POA: Diagnosis not present

## 2023-12-14 DIAGNOSIS — Z880 Allergy status to penicillin: Secondary | ICD-10-CM | POA: Diagnosis not present

## 2023-12-14 DIAGNOSIS — J449 Chronic obstructive pulmonary disease, unspecified: Secondary | ICD-10-CM | POA: Diagnosis not present

## 2023-12-14 DIAGNOSIS — N390 Urinary tract infection, site not specified: Secondary | ICD-10-CM | POA: Diagnosis not present

## 2023-12-14 DIAGNOSIS — I674 Hypertensive encephalopathy: Secondary | ICD-10-CM | POA: Diagnosis not present

## 2023-12-14 DIAGNOSIS — E871 Hypo-osmolality and hyponatremia: Secondary | ICD-10-CM | POA: Diagnosis not present

## 2023-12-14 DIAGNOSIS — N1832 Chronic kidney disease, stage 3b: Secondary | ICD-10-CM | POA: Diagnosis not present

## 2023-12-14 DIAGNOSIS — I639 Cerebral infarction, unspecified: Secondary | ICD-10-CM | POA: Diagnosis not present

## 2023-12-14 DIAGNOSIS — Z885 Allergy status to narcotic agent status: Secondary | ICD-10-CM | POA: Diagnosis not present

## 2023-12-14 DIAGNOSIS — I252 Old myocardial infarction: Secondary | ICD-10-CM | POA: Diagnosis not present

## 2023-12-14 DIAGNOSIS — I129 Hypertensive chronic kidney disease with stage 1 through stage 4 chronic kidney disease, or unspecified chronic kidney disease: Secondary | ICD-10-CM | POA: Diagnosis not present

## 2023-12-14 DIAGNOSIS — F32A Depression, unspecified: Secondary | ICD-10-CM | POA: Diagnosis not present

## 2023-12-14 DIAGNOSIS — I160 Hypertensive urgency: Secondary | ICD-10-CM | POA: Diagnosis not present

## 2023-12-14 DIAGNOSIS — R9431 Abnormal electrocardiogram [ECG] [EKG]: Secondary | ICD-10-CM | POA: Diagnosis not present

## 2023-12-14 DIAGNOSIS — M1990 Unspecified osteoarthritis, unspecified site: Secondary | ICD-10-CM | POA: Diagnosis not present

## 2023-12-14 DIAGNOSIS — R000 Tachycardia, unspecified: Secondary | ICD-10-CM | POA: Diagnosis not present

## 2023-12-14 DIAGNOSIS — E1165 Type 2 diabetes mellitus with hyperglycemia: Secondary | ICD-10-CM | POA: Diagnosis not present

## 2023-12-14 DIAGNOSIS — G9349 Other encephalopathy: Secondary | ICD-10-CM | POA: Diagnosis not present

## 2023-12-14 DIAGNOSIS — R4182 Altered mental status, unspecified: Secondary | ICD-10-CM | POA: Diagnosis not present

## 2023-12-14 DIAGNOSIS — M109 Gout, unspecified: Secondary | ICD-10-CM | POA: Diagnosis not present

## 2023-12-14 DIAGNOSIS — I4581 Long QT syndrome: Secondary | ICD-10-CM | POA: Diagnosis not present

## 2023-12-14 DIAGNOSIS — R569 Unspecified convulsions: Secondary | ICD-10-CM | POA: Diagnosis not present

## 2023-12-14 DIAGNOSIS — F419 Anxiety disorder, unspecified: Secondary | ICD-10-CM | POA: Diagnosis not present

## 2023-12-14 DIAGNOSIS — E559 Vitamin D deficiency, unspecified: Secondary | ICD-10-CM | POA: Diagnosis not present

## 2023-12-14 DIAGNOSIS — G40409 Other generalized epilepsy and epileptic syndromes, not intractable, without status epilepticus: Secondary | ICD-10-CM | POA: Diagnosis not present

## 2023-12-14 DIAGNOSIS — Z882 Allergy status to sulfonamides status: Secondary | ICD-10-CM | POA: Diagnosis not present

## 2023-12-14 DIAGNOSIS — I510 Cardiac septal defect, acquired: Secondary | ICD-10-CM | POA: Diagnosis not present

## 2023-12-15 DIAGNOSIS — N179 Acute kidney failure, unspecified: Secondary | ICD-10-CM | POA: Diagnosis not present

## 2023-12-17 DIAGNOSIS — R9431 Abnormal electrocardiogram [ECG] [EKG]: Secondary | ICD-10-CM | POA: Diagnosis not present

## 2023-12-17 DIAGNOSIS — I4581 Long QT syndrome: Secondary | ICD-10-CM | POA: Diagnosis not present

## 2023-12-17 DIAGNOSIS — R000 Tachycardia, unspecified: Secondary | ICD-10-CM | POA: Diagnosis not present

## 2023-12-19 ENCOUNTER — Ambulatory Visit: Admitting: Internal Medicine

## 2023-12-19 ENCOUNTER — Encounter: Payer: Self-pay | Admitting: Internal Medicine

## 2023-12-19 VITALS — BP 130/90 | HR 11 | Temp 97.6°F | Resp 18 | Ht 65.0 in | Wt 215.0 lb

## 2023-12-19 DIAGNOSIS — N179 Acute kidney failure, unspecified: Secondary | ICD-10-CM

## 2023-12-19 DIAGNOSIS — I10 Essential (primary) hypertension: Secondary | ICD-10-CM

## 2023-12-19 DIAGNOSIS — I6389 Other cerebral infarction: Secondary | ICD-10-CM | POA: Insufficient documentation

## 2023-12-19 DIAGNOSIS — E1169 Type 2 diabetes mellitus with other specified complication: Secondary | ICD-10-CM

## 2023-12-19 NOTE — Progress Notes (Signed)
 Office Visit  Subjective   Patient ID: Sarah Maldonado   DOB: [birth year]   Age: 53 y.o.   MRN: [id number]   Chief Complaint Chief Complaint  Patient presents with   Hospitalization Follow-up     History of Present Illness 53 years old female is here for follow up from [facility] discharge. She was admitted to hospital on 12/14/23 with encephalopathy and she was discharge on 9/24, 2025. She has stroke left parietal infarction due to uncontrolled hypertension and uncontrolled diabetes.  She was seen by tele neurology and workup shows no significant carotid stenosis on venous Doppler.  Echo shows significant left ventricular hypertrophy systolic function is within normal limit.  She will follow-up with cardiology for evaluation of  significant left ventricular hypertrophy.  She was initially started on IV seizure medications but neurologist felt that she does not have any seizures so medication were stopped.  Her swallow evaluation was normal.  She has slight weakness in her legs and she will start physical therapy from next week.  She was discharged home on aspirin 81 mg daily.  She was started on atorvastatin 40 mg daily with the goal to keep LDL below 70.   She has hypertensive urgency while in hospital.  She also has acute kidney injury with creatinine of 2.9 and she was given fluid.  She was advised to follow up with nephrologist as outpatient.  For blood pressure medications she was continued on carvedilol 25 mg twice a day, hydralazine  100 mg twice a day, amlodipine 10 mg daily  I have reviewed her medications.  And her blood pressure is better today.    She also has uncontrolled diabetes mellitus and her hemoglobin A1c was greater than 13. She was discharged home on Lantus 15 units twice a day and mealtime coverage.  She says that her blood sugar is better now.  Past Medical History Past Medical History:  Diagnosis Date   Abscess of left thigh 07/09/2018   Arthritis    Asthma     Brachial neuritis 11/23/2008   Carpal tunnel syndrome 11/23/2008   Chronic pain syndrome    Confusion 06/06/2019   COVID-19 06/05/2019   Depression    Diabetes mellitus (HCC)    DKA (diabetic ketoacidosis) (HCC) 06/05/2019   Emesis 06/06/2019   Essential hypertension 01/29/2018   GERD (gastroesophageal reflux disease)    Hyperlipidemia    Hypertension    Hypertriglyceridemia    Intervertebral disc disorder of lumbar region with myelopathy 11/23/2008   Mixed dyslipidemia 01/29/2018   Morbid obesity (HCC) 08/20/2018   OSA (obstructive sleep apnea)    Overweight 01/29/2018   Postoperative examination 07/09/2018   Syncope 01/29/2018   Type 2 diabetes mellitus with unspecified complications (HCC) 01/29/2018     Allergies Allergies  Allergen Reactions   Amoxicillin Hives   Contrast Media [Iodinated Contrast Media] Nausea And Vomiting   Latex    Morphine     Penicillins Hives     Review of Systems Review of Systems  Constitutional: Negative.   HENT: Negative.    Respiratory: Negative.    Cardiovascular: Negative.   Gastrointestinal: Negative.   Neurological:  Positive for weakness.       Objective:    Vitals BP (!) 130/90   Pulse (!) 11   Temp 97.6 F (36.4 C)   Resp 18   Ht 5' 5 (1.651 m)   Wt 215 lb (97.5 kg)   SpO2 99%   BMI 35.78 kg/m  Physical Examination Physical Exam Constitutional:      Appearance: Normal appearance.  HENT:     Head: Normocephalic and atraumatic.  Cardiovascular:     Rate and Rhythm: Normal rate and regular rhythm.     Heart sounds: Normal heart sounds.  Pulmonary:     Effort: Pulmonary effort is normal.     Breath sounds: Normal breath sounds.  Abdominal:     General: Bowel sounds are normal.     Palpations: Abdomen is soft.  Neurological:     General: No focal deficit present.     Mental Status: She is alert and oriented to person, place, and time.        Assessment & Plan:   Acute kidney injury superimposed on  CKD   Her creatinine was 2.82 days ago.  She will follow-up with nephrologist as outpatient.  Parietal lobe infarction [facility])   She will take baby aspirin 81 mg d  Essential hypertension   Her blood pressure is better and she will continue to monitor blood pressure at home.  Dyslipidemia associated with type 2 diabetes mellitus (HCC)   Need to monitor her blood sugar and keep LDL below 70.  She is taking atorvastatin 40 mg daily.  I will repeat lipid panel and CMP in 3 weeks.     This is a transition of care visit.  I have reviewed medication with her and I have reviewed discharge summary.  She will follow-up with cardiologist, nephrologist and neurologist.  Return in about 1 month (around 01/18/2024).   Roetta Dare, MD

## 2023-12-19 NOTE — Assessment & Plan Note (Signed)
 Need to monitor her blood sugar and keep LDL below 70.  She is taking atorvastatin 40 mg daily.  I will repeat lipid panel and CMP in 3 weeks.

## 2023-12-19 NOTE — Assessment & Plan Note (Signed)
 She will take baby aspirin 81 mg d

## 2023-12-19 NOTE — Assessment & Plan Note (Signed)
 Her creatinine was 2.82 days ago.  She will follow-up with nephrologist as outpatient.

## 2023-12-19 NOTE — Assessment & Plan Note (Signed)
 Her blood pressure is better and she will continue to monitor blood pressure at home.

## 2023-12-26 DIAGNOSIS — E119 Type 2 diabetes mellitus without complications: Secondary | ICD-10-CM | POA: Diagnosis not present

## 2023-12-26 DIAGNOSIS — I10 Essential (primary) hypertension: Secondary | ICD-10-CM | POA: Diagnosis not present

## 2023-12-26 DIAGNOSIS — E1169 Type 2 diabetes mellitus with other specified complication: Secondary | ICD-10-CM | POA: Diagnosis not present

## 2023-12-26 DIAGNOSIS — G4733 Obstructive sleep apnea (adult) (pediatric): Secondary | ICD-10-CM | POA: Diagnosis not present

## 2023-12-26 DIAGNOSIS — E785 Hyperlipidemia, unspecified: Secondary | ICD-10-CM | POA: Diagnosis not present

## 2023-12-26 DIAGNOSIS — M5106 Intervertebral disc disorders with myelopathy, lumbar region: Secondary | ICD-10-CM | POA: Diagnosis not present

## 2023-12-26 DIAGNOSIS — M47816 Spondylosis without myelopathy or radiculopathy, lumbar region: Secondary | ICD-10-CM | POA: Diagnosis not present

## 2023-12-26 DIAGNOSIS — J45909 Unspecified asthma, uncomplicated: Secondary | ICD-10-CM | POA: Diagnosis not present

## 2023-12-26 DIAGNOSIS — F32A Depression, unspecified: Secondary | ICD-10-CM | POA: Diagnosis not present

## 2023-12-26 DIAGNOSIS — K219 Gastro-esophageal reflux disease without esophagitis: Secondary | ICD-10-CM | POA: Diagnosis not present

## 2023-12-28 ENCOUNTER — Other Ambulatory Visit: Payer: Self-pay | Admitting: Internal Medicine

## 2023-12-28 DIAGNOSIS — G4489 Other headache syndrome: Secondary | ICD-10-CM | POA: Diagnosis not present

## 2023-12-28 DIAGNOSIS — Z5329 Procedure and treatment not carried out because of patient's decision for other reasons: Secondary | ICD-10-CM | POA: Diagnosis not present

## 2023-12-28 DIAGNOSIS — Z79899 Other long term (current) drug therapy: Secondary | ICD-10-CM | POA: Diagnosis not present

## 2023-12-28 DIAGNOSIS — R000 Tachycardia, unspecified: Secondary | ICD-10-CM | POA: Diagnosis not present

## 2023-12-28 DIAGNOSIS — Z79891 Long term (current) use of opiate analgesic: Secondary | ICD-10-CM | POA: Diagnosis not present

## 2023-12-28 DIAGNOSIS — R079 Chest pain, unspecified: Secondary | ICD-10-CM | POA: Diagnosis not present

## 2023-12-28 DIAGNOSIS — R569 Unspecified convulsions: Secondary | ICD-10-CM | POA: Diagnosis not present

## 2023-12-28 DIAGNOSIS — R9431 Abnormal electrocardiogram [ECG] [EKG]: Secondary | ICD-10-CM | POA: Diagnosis not present

## 2023-12-28 DIAGNOSIS — I160 Hypertensive urgency: Secondary | ICD-10-CM | POA: Diagnosis not present

## 2024-01-01 DIAGNOSIS — E113413 Type 2 diabetes mellitus with severe nonproliferative diabetic retinopathy with macular edema, bilateral: Secondary | ICD-10-CM | POA: Diagnosis not present

## 2024-01-08 ENCOUNTER — Encounter: Payer: Self-pay | Admitting: *Deleted

## 2024-01-08 DIAGNOSIS — Z8673 Personal history of transient ischemic attack (TIA), and cerebral infarction without residual deficits: Secondary | ICD-10-CM | POA: Diagnosis not present

## 2024-01-08 DIAGNOSIS — R2689 Other abnormalities of gait and mobility: Secondary | ICD-10-CM | POA: Diagnosis not present

## 2024-01-08 DIAGNOSIS — R293 Abnormal posture: Secondary | ICD-10-CM | POA: Diagnosis not present

## 2024-01-08 DIAGNOSIS — M6281 Muscle weakness (generalized): Secondary | ICD-10-CM | POA: Diagnosis not present

## 2024-01-08 DIAGNOSIS — R2681 Unsteadiness on feet: Secondary | ICD-10-CM | POA: Diagnosis not present

## 2024-01-08 NOTE — Progress Notes (Signed)
 Sarah Maldonado                                          MRN: [id number]   01/08/2024   The [facility] Quality Team Specialist reviewed this patient medical record for the purposes of chart review for care gap closure. The following were reviewed: chart review for care gap closure-glycemic status assessment.    [facility] Quality Team

## 2024-01-15 DIAGNOSIS — R590 Localized enlarged lymph nodes: Secondary | ICD-10-CM | POA: Diagnosis not present

## 2024-01-15 DIAGNOSIS — K8689 Other specified diseases of pancreas: Secondary | ICD-10-CM | POA: Diagnosis not present

## 2024-01-15 DIAGNOSIS — G4730 Sleep apnea, unspecified: Secondary | ICD-10-CM | POA: Diagnosis not present

## 2024-01-15 DIAGNOSIS — K7689 Other specified diseases of liver: Secondary | ICD-10-CM | POA: Diagnosis not present

## 2024-01-15 DIAGNOSIS — J45909 Unspecified asthma, uncomplicated: Secondary | ICD-10-CM | POA: Diagnosis not present

## 2024-01-15 DIAGNOSIS — K859 Acute pancreatitis without necrosis or infection, unspecified: Secondary | ICD-10-CM | POA: Diagnosis not present

## 2024-01-15 DIAGNOSIS — Z13810 Encounter for screening for upper gastrointestinal disorder: Secondary | ICD-10-CM | POA: Diagnosis not present

## 2024-01-15 DIAGNOSIS — N289 Disorder of kidney and ureter, unspecified: Secondary | ICD-10-CM | POA: Diagnosis not present

## 2024-01-15 DIAGNOSIS — I10 Essential (primary) hypertension: Secondary | ICD-10-CM | POA: Diagnosis not present

## 2024-01-16 DIAGNOSIS — N179 Acute kidney failure, unspecified: Secondary | ICD-10-CM | POA: Diagnosis not present

## 2024-01-16 DIAGNOSIS — E1169 Type 2 diabetes mellitus with other specified complication: Secondary | ICD-10-CM | POA: Diagnosis not present

## 2024-01-16 DIAGNOSIS — I6389 Other cerebral infarction: Secondary | ICD-10-CM

## 2024-01-16 DIAGNOSIS — I10 Essential (primary) hypertension: Secondary | ICD-10-CM | POA: Diagnosis not present

## 2024-01-16 DIAGNOSIS — N189 Chronic kidney disease, unspecified: Secondary | ICD-10-CM | POA: Diagnosis not present

## 2024-01-16 DIAGNOSIS — E785 Hyperlipidemia, unspecified: Secondary | ICD-10-CM | POA: Diagnosis not present

## 2024-01-17 DIAGNOSIS — R600 Localized edema: Secondary | ICD-10-CM | POA: Diagnosis not present

## 2024-01-17 DIAGNOSIS — I10 Essential (primary) hypertension: Secondary | ICD-10-CM | POA: Diagnosis not present

## 2024-01-17 DIAGNOSIS — N183 Chronic kidney disease, stage 3 unspecified: Secondary | ICD-10-CM | POA: Diagnosis not present

## 2024-01-17 DIAGNOSIS — I509 Heart failure, unspecified: Secondary | ICD-10-CM | POA: Diagnosis not present

## 2024-01-17 DIAGNOSIS — E1122 Type 2 diabetes mellitus with diabetic chronic kidney disease: Secondary | ICD-10-CM | POA: Diagnosis not present

## 2024-01-17 DIAGNOSIS — I129 Hypertensive chronic kidney disease with stage 1 through stage 4 chronic kidney disease, or unspecified chronic kidney disease: Secondary | ICD-10-CM | POA: Diagnosis not present

## 2024-01-17 DIAGNOSIS — Z8673 Personal history of transient ischemic attack (TIA), and cerebral infarction without residual deficits: Secondary | ICD-10-CM | POA: Diagnosis not present

## 2024-01-17 DIAGNOSIS — Z9181 History of falling: Secondary | ICD-10-CM | POA: Diagnosis not present

## 2024-01-17 DIAGNOSIS — R296 Repeated falls: Secondary | ICD-10-CM | POA: Diagnosis not present

## 2024-01-19 ENCOUNTER — Ambulatory Visit: Admitting: Internal Medicine

## 2024-01-19 ENCOUNTER — Encounter: Payer: Self-pay | Admitting: Internal Medicine

## 2024-01-19 VITALS — BP 130/90 | HR 100 | Temp 97.3°F | Resp 18 | Ht 65.0 in | Wt 241.0 lb

## 2024-01-19 DIAGNOSIS — E0822 Diabetes mellitus due to underlying condition with diabetic chronic kidney disease: Secondary | ICD-10-CM | POA: Diagnosis not present

## 2024-01-19 DIAGNOSIS — R262 Difficulty in walking, not elsewhere classified: Secondary | ICD-10-CM | POA: Diagnosis not present

## 2024-01-19 DIAGNOSIS — I10 Essential (primary) hypertension: Secondary | ICD-10-CM

## 2024-01-19 DIAGNOSIS — Z794 Long term (current) use of insulin: Secondary | ICD-10-CM | POA: Diagnosis not present

## 2024-01-19 DIAGNOSIS — N1832 Chronic kidney disease, stage 3b: Secondary | ICD-10-CM | POA: Diagnosis not present

## 2024-01-19 DIAGNOSIS — R296 Repeated falls: Secondary | ICD-10-CM | POA: Diagnosis not present

## 2024-01-19 NOTE — Assessment & Plan Note (Signed)
 I have given her a prescription for wheelchair.

## 2024-01-19 NOTE — Assessment & Plan Note (Signed)
 Her blood pressure is better today but her blood pressure was 181/109 in the emergency room.  I have suggested that she has to take her medication regularly.

## 2024-01-19 NOTE — Assessment & Plan Note (Signed)
 I will do hemoglobin A1c and CMP today.

## 2024-01-19 NOTE — Assessment & Plan Note (Signed)
 I will refer her to be evaluated by occupational therapist for motorized wheelchair.  In the meantime I will give her prescription for manual wheelchair.  I believe use of a manual wheelchair will significantly improve the beneficiary's ability to participate in MR ADL and benefit refill use it on a regular basis at home.  She will continue with physical therapy as well.

## 2024-01-19 NOTE — Progress Notes (Signed)
 Office Visit  Subjective   Patient ID: Sarah Maldonado   DOB: 07-17-1970   Age: 53 y.o.   MRN: [id number]   Chief Complaint Chief Complaint  Patient presents with   office visit    Needs a motorized scooter      History of Present Illness 53 years old female is here for follow up from [facility] ED visit.  She says that she has recurrent fall but denies having any loss of consciousness.  She has a chronic back pain and she says that because of recurrent fall her pain is worse now.  She has a history of stroke and she is currently getting physical therapy.  She went to the emergency room on October 25th after a fall.  She was discharged home and she was advised to have follow-up with me.  She says that she do not get any warning and just fell.  She is asking for wheelchair as Rollator is not helping her.    She has stroke left parietal infarction due to uncontrolled hypertension and uncontrolled diabetes.  She was seen by tele neurology and workup shows no significant carotid stenosis on venous Doppler.  Echo shows significant left ventricular hypertrophy systolic function is within normal limit.      She says that she is watching her diet and cut back on her salt intake.  Her blood pressure is better today.  She want motorized wheelchair.  I have explained to her that I need to refer her to see occupational therapy for evaluation for motorized wheelchair.    She also has increased swelling in her leg.  She was started on torsemide 20 mg  And hydrochlorothiazide  25 mg was also added in the emergency room but she says that she has not filled the prescription yet. She has a chronic kidney disease with stage III with uncontrolled diabetes.  She says that her blood sugar is also better.  Her hemoglobin A1c was 15 in July 2025.    Past Medical History Past Medical History:  Diagnosis Date   Abscess of left thigh 07/09/2018   Arthritis    Asthma    Brachial neuritis 11/23/2008    Carpal tunnel syndrome 11/23/2008   Chronic pain syndrome    Confusion 06/06/2019   COVID-19 06/05/2019   Depression    Diabetes mellitus (HCC)    DKA (diabetic ketoacidosis) (HCC) 06/05/2019   Emesis 06/06/2019   Essential hypertension 01/29/2018   GERD (gastroesophageal reflux disease)    Hyperlipidemia    Hypertension    Hypertriglyceridemia    Intervertebral disc disorder of lumbar region with myelopathy 11/23/2008   Mixed dyslipidemia 01/29/2018   Morbid obesity (HCC) 08/20/2018   OSA (obstructive sleep apnea)    Overweight 01/29/2018   Postoperative examination 07/09/2018   Syncope 01/29/2018   Type 2 diabetes mellitus with unspecified complications (HCC) 01/29/2018     Allergies Allergies  Allergen Reactions   Amoxicillin Hives   Contrast Media [Iodinated Contrast Media] Nausea And Vomiting   Latex    Morphine     Penicillins Hives     Review of Systems Review of Systems  Constitutional: Negative.   Respiratory: Negative.    Cardiovascular: Negative.   Musculoskeletal:  Positive for back pain.  Neurological:  Positive for weakness.       Objective:    Vitals BP (!) 130/90   Pulse 100   Temp (!) 97.3 F (36.3 C)   Resp 18   Ht 5' 5 (1.651  m)   Wt 241 lb (109.3 kg)   SpO2 98%   BMI 40.10 kg/m    Physical Examination Physical Exam Constitutional:      Appearance: Normal appearance.  Cardiovascular:     Rate and Rhythm: Normal rate and regular rhythm.     Heart sounds: Normal heart sounds.  Pulmonary:     Effort: Pulmonary effort is normal.     Breath sounds: Normal breath sounds.  Abdominal:     General: Bowel sounds are normal.     Palpations: Abdomen is soft.  Musculoskeletal:     Right lower leg: Edema present.     Left lower leg: Edema present.  Neurological:     Mental Status: She is alert.        Assessment & Plan:   Essential hypertension  Her blood pressure is better today but her blood pressure was 181/109 in the emergency  room.  I have suggested that she has to take her medication regularly.  Diabetes mellitus due to underlying condition with stage 3b chronic kidney disease, with long-term current use of insulin  (HCC)   I will do hemoglobin A1c and CMP today.  Unexplained recurrent falls   I will refer her to be evaluated by occupational therapist for motorized wheelchair.  In the meantime I will give her prescription for manual wheelchair.  I believe use of a manual wheelchair will significantly improve the beneficiary's ability to participate in [facility] AD[facility] and benefit refill use it on a regular basis at home.  She will continue with physical therapy as well.  Ambulatory dysfunction   I have given her a prescription for wheelchair.    Return in about 1 month (around 02/19/2024).   Roetta Dare, MD

## 2024-01-22 DIAGNOSIS — E113413 Type 2 diabetes mellitus with severe nonproliferative diabetic retinopathy with macular edema, bilateral: Secondary | ICD-10-CM | POA: Diagnosis not present

## 2024-01-26 DIAGNOSIS — R293 Abnormal posture: Secondary | ICD-10-CM | POA: Diagnosis not present

## 2024-01-26 DIAGNOSIS — I69398 Other sequelae of cerebral infarction: Secondary | ICD-10-CM | POA: Diagnosis not present

## 2024-01-26 DIAGNOSIS — R2681 Unsteadiness on feet: Secondary | ICD-10-CM | POA: Diagnosis not present

## 2024-01-26 DIAGNOSIS — R2689 Other abnormalities of gait and mobility: Secondary | ICD-10-CM | POA: Diagnosis not present

## 2024-01-26 DIAGNOSIS — M6281 Muscle weakness (generalized): Secondary | ICD-10-CM | POA: Diagnosis not present

## 2024-02-02 DIAGNOSIS — R2689 Other abnormalities of gait and mobility: Secondary | ICD-10-CM | POA: Diagnosis not present

## 2024-02-02 DIAGNOSIS — R2681 Unsteadiness on feet: Secondary | ICD-10-CM | POA: Diagnosis not present

## 2024-02-02 DIAGNOSIS — M6281 Muscle weakness (generalized): Secondary | ICD-10-CM | POA: Diagnosis not present

## 2024-02-02 DIAGNOSIS — I69398 Other sequelae of cerebral infarction: Secondary | ICD-10-CM | POA: Diagnosis not present

## 2024-02-02 DIAGNOSIS — R293 Abnormal posture: Secondary | ICD-10-CM | POA: Diagnosis not present

## 2024-02-04 DIAGNOSIS — K8500 Idiopathic acute pancreatitis without necrosis or infection: Secondary | ICD-10-CM | POA: Diagnosis not present

## 2024-02-04 DIAGNOSIS — R112 Nausea with vomiting, unspecified: Secondary | ICD-10-CM | POA: Diagnosis not present

## 2024-02-04 DIAGNOSIS — R1012 Left upper quadrant pain: Secondary | ICD-10-CM | POA: Diagnosis not present

## 2024-02-06 DIAGNOSIS — E119 Type 2 diabetes mellitus without complications: Secondary | ICD-10-CM | POA: Diagnosis not present

## 2024-02-06 DIAGNOSIS — I10 Essential (primary) hypertension: Secondary | ICD-10-CM | POA: Diagnosis not present

## 2024-02-06 DIAGNOSIS — F32A Depression, unspecified: Secondary | ICD-10-CM | POA: Diagnosis not present

## 2024-02-06 DIAGNOSIS — G5600 Carpal tunnel syndrome, unspecified upper limb: Secondary | ICD-10-CM | POA: Diagnosis not present

## 2024-02-06 DIAGNOSIS — M5106 Intervertebral disc disorders with myelopathy, lumbar region: Secondary | ICD-10-CM | POA: Diagnosis not present

## 2024-02-06 DIAGNOSIS — G4733 Obstructive sleep apnea (adult) (pediatric): Secondary | ICD-10-CM | POA: Diagnosis not present

## 2024-02-06 DIAGNOSIS — M47816 Spondylosis without myelopathy or radiculopathy, lumbar region: Secondary | ICD-10-CM | POA: Diagnosis not present

## 2024-02-06 DIAGNOSIS — K219 Gastro-esophageal reflux disease without esophagitis: Secondary | ICD-10-CM | POA: Diagnosis not present

## 2024-02-06 DIAGNOSIS — E785 Hyperlipidemia, unspecified: Secondary | ICD-10-CM | POA: Diagnosis not present

## 2024-02-06 DIAGNOSIS — J45909 Unspecified asthma, uncomplicated: Secondary | ICD-10-CM | POA: Diagnosis not present

## 2024-02-06 DIAGNOSIS — E1169 Type 2 diabetes mellitus with other specified complication: Secondary | ICD-10-CM | POA: Diagnosis not present

## 2024-02-16 ENCOUNTER — Encounter: Payer: Self-pay | Admitting: Internal Medicine

## 2024-02-16 ENCOUNTER — Ambulatory Visit: Admitting: Internal Medicine

## 2024-02-16 VITALS — BP 130/90 | HR 109 | Temp 97.1°F | Resp 18 | Ht 65.0 in | Wt 221.4 lb

## 2024-02-16 DIAGNOSIS — I10 Essential (primary) hypertension: Secondary | ICD-10-CM | POA: Diagnosis not present

## 2024-02-16 DIAGNOSIS — E0822 Diabetes mellitus due to underlying condition with diabetic chronic kidney disease: Secondary | ICD-10-CM

## 2024-02-16 DIAGNOSIS — R600 Localized edema: Secondary | ICD-10-CM | POA: Diagnosis not present

## 2024-02-16 DIAGNOSIS — N1832 Chronic kidney disease, stage 3b: Secondary | ICD-10-CM

## 2024-02-16 DIAGNOSIS — E782 Mixed hyperlipidemia: Secondary | ICD-10-CM | POA: Diagnosis not present

## 2024-02-16 DIAGNOSIS — Z794 Long term (current) use of insulin: Secondary | ICD-10-CM | POA: Diagnosis not present

## 2024-02-16 MED ORDER — TORSEMIDE 10 MG PO TABS: 10.0000 mg | ORAL_TABLET | Freq: Every day | ORAL | 3 refills | Status: AC

## 2024-02-16 MED ORDER — TORSEMIDE 10 MG PO TABS: 10.0000 mg | ORAL_TABLET | Freq: Every day | ORAL | 3 refills | Status: DC

## 2024-02-16 NOTE — Assessment & Plan Note (Signed)
 She has swelling in her legs and she will take torsemide  10 mg daily.

## 2024-02-16 NOTE — Assessment & Plan Note (Signed)
 I will repeat hemoglobin A1c and CMP today.

## 2024-02-16 NOTE — Progress Notes (Signed)
 Office Visit  Subjective   Patient ID: Sarah Maldonado   DOB: Feb 19, 1971   Age: 53 y.o.   MRN: [id number]   Chief Complaint Chief Complaint  Patient presents with   Follow-up    2 month     History of Present Illness 53 years old female is here for follow up. She says that she went to ED with abdomen pain 2 weeks ago and she was diagnosed with acute pancreatitis. She was send home with nausea medicine. She says she still has abdomen pain. She has pancreatitis in July 2025 also.  She has stroke left parietal infarction due to uncontrolled hypertension in 2025. She says that she never saw any neurologist after that. She was seen by tele neurology while she was in hospital and workup shows no significant carotid stenosis on venous Doppler.  Echo shows significant left ventricular hypertrophy, systolic function is within normal limit.   She has slight weakness in her right side. She walk with rolator. She is getting PT. She is taking aspirin 81 mg daily.   She was started on atorvastatin 40 mg daily with the goal to keep LDL below 70.    She has hypertensive urgency while in hospital.  She also has acute kidney injury with creatinine of 2.9 and she was given fluid.  She was advised to follow up with nephrologist as outpatient.  For blood pressure medications she was continued on carvedilol 25 mg twice a day, hydralazine  100 mg twice a day, amlodipine 10 mg daily  I have reviewed her medications.  And her blood pressure is better today.     She also has uncontrolled diabetes mellitus and her hemoglobin A1c was greater than 13. She was discharged home on Lantus 15 units twice a day and mealtime coverage.  She says that her blood sugar is better now. She is schedule for cataract surgery on December 14.   She has CKD 3b with GFR 32 in July 25 with 4 gram proteinuria.   Past Medical History Past Medical History:  Diagnosis Date   Abscess of left thigh 07/09/2018   Arthritis    Asthma     Brachial neuritis 11/23/2008   Carpal tunnel syndrome 11/23/2008   Chronic pain syndrome    Confusion 06/06/2019   COVID-19 06/05/2019   Depression    Diabetes mellitus (HCC)    DKA (diabetic ketoacidosis) (HCC) 06/05/2019   Emesis 06/06/2019   Essential hypertension 01/29/2018   GERD (gastroesophageal reflux disease)    Hyperlipidemia    Hypertension    Hypertriglyceridemia    Intervertebral disc disorder of lumbar region with myelopathy 11/23/2008   Mixed dyslipidemia 01/29/2018   Morbid obesity (HCC) 08/20/2018   OSA (obstructive sleep apnea)    Overweight 01/29/2018   Postoperative examination 07/09/2018   Syncope 01/29/2018   Type 2 diabetes mellitus with unspecified complications (HCC) 01/29/2018     Allergies Allergies  Allergen Reactions   Amoxicillin Hives   Contrast Media [Iodinated Contrast Media] Nausea And Vomiting   Latex    Morphine     Penicillins Hives     Review of Systems Review of Systems  Constitutional: Negative.   Respiratory: Negative.    Cardiovascular:  Positive for leg swelling.  Gastrointestinal: Negative.   Neurological:        Right side weakness       Objective:    Vitals BP (!) 130/90 (BP Location: Left Arm, Patient Position: Sitting, Cuff Size: Large)   Pulse (!) 109  Temp (!) 97.1 F (36.2 C)   Resp 18   Ht 5' 5 (1.651 m)   Wt 221 lb 6 oz (100.4 kg)   SpO2 98%   BMI 36.84 kg/m    Physical Examination Physical Exam Constitutional:      Appearance: Normal appearance. She is obese.  HENT:     Head: Normocephalic and atraumatic.  Cardiovascular:     Rate and Rhythm: Normal rate and regular rhythm.     Heart sounds: Normal heart sounds.  Pulmonary:     Effort: Pulmonary effort is normal.     Breath sounds: Normal breath sounds.  Abdominal:     General: Bowel sounds are normal.     Palpations: Abdomen is soft.  Neurological:     General: No focal deficit present.     Mental Status: She is alert and oriented to  person, place, and time.        Assessment & Plan:   Essential hypertension   Her blood pressure is better.  Diabetes mellitus due to underlying condition with stage 3b chronic kidney disease, with long-term current use of insulin  (HCC)   I will repeat hemoglobin A1c and CMP today.  Mixed dyslipidemia   She has a stroke and diabetes mellitus.  I will do lipid panel today.  She take atorvastatin 40 mg daily.  Bilateral leg edema   She has swelling in her legs and she will take torsemide  10 mg daily.    Return in about 1 month (around 03/17/2024).   Roetta Dare, MD

## 2024-02-16 NOTE — Assessment & Plan Note (Signed)
 She has a stroke and diabetes mellitus.  I will do lipid panel today.  She take atorvastatin 40 mg daily.

## 2024-02-16 NOTE — Assessment & Plan Note (Signed)
 Her blood pressure is better.

## 2024-02-17 DIAGNOSIS — M79604 Pain in right leg: Secondary | ICD-10-CM | POA: Diagnosis not present

## 2024-02-17 DIAGNOSIS — R600 Localized edema: Secondary | ICD-10-CM | POA: Diagnosis not present

## 2024-02-17 DIAGNOSIS — I161 Hypertensive emergency: Secondary | ICD-10-CM | POA: Diagnosis not present

## 2024-02-17 DIAGNOSIS — N179 Acute kidney failure, unspecified: Secondary | ICD-10-CM | POA: Diagnosis not present

## 2024-02-17 DIAGNOSIS — M7989 Other specified soft tissue disorders: Secondary | ICD-10-CM | POA: Diagnosis not present

## 2024-02-17 DIAGNOSIS — I10 Essential (primary) hypertension: Secondary | ICD-10-CM | POA: Diagnosis not present

## 2024-02-17 DIAGNOSIS — R0609 Other forms of dyspnea: Secondary | ICD-10-CM | POA: Diagnosis not present

## 2024-02-17 DIAGNOSIS — E876 Hypokalemia: Secondary | ICD-10-CM | POA: Diagnosis not present

## 2024-02-18 ENCOUNTER — Ambulatory Visit: Admitting: Internal Medicine

## 2024-02-18 DIAGNOSIS — I509 Heart failure, unspecified: Secondary | ICD-10-CM | POA: Diagnosis not present

## 2024-02-18 DIAGNOSIS — R0602 Shortness of breath: Secondary | ICD-10-CM | POA: Diagnosis not present

## 2024-02-18 DIAGNOSIS — I130 Hypertensive heart and chronic kidney disease with heart failure and stage 1 through stage 4 chronic kidney disease, or unspecified chronic kidney disease: Secondary | ICD-10-CM | POA: Diagnosis not present

## 2024-02-18 DIAGNOSIS — E876 Hypokalemia: Secondary | ICD-10-CM | POA: Diagnosis not present

## 2024-02-18 DIAGNOSIS — I5030 Unspecified diastolic (congestive) heart failure: Secondary | ICD-10-CM | POA: Diagnosis not present

## 2024-02-18 DIAGNOSIS — R0609 Other forms of dyspnea: Secondary | ICD-10-CM | POA: Diagnosis not present

## 2024-02-18 DIAGNOSIS — M109 Gout, unspecified: Secondary | ICD-10-CM | POA: Diagnosis not present

## 2024-02-18 DIAGNOSIS — N184 Chronic kidney disease, stage 4 (severe): Secondary | ICD-10-CM | POA: Diagnosis not present

## 2024-02-18 DIAGNOSIS — M1990 Unspecified osteoarthritis, unspecified site: Secondary | ICD-10-CM | POA: Diagnosis not present

## 2024-02-18 DIAGNOSIS — E7800 Pure hypercholesterolemia, unspecified: Secondary | ICD-10-CM | POA: Diagnosis not present

## 2024-02-18 DIAGNOSIS — F419 Anxiety disorder, unspecified: Secondary | ICD-10-CM | POA: Diagnosis not present

## 2024-02-18 DIAGNOSIS — I1310 Hypertensive heart and chronic kidney disease without heart failure, with stage 1 through stage 4 chronic kidney disease, or unspecified chronic kidney disease: Secondary | ICD-10-CM | POA: Diagnosis not present

## 2024-02-18 DIAGNOSIS — I5031 Acute diastolic (congestive) heart failure: Secondary | ICD-10-CM | POA: Diagnosis not present

## 2024-02-18 DIAGNOSIS — D631 Anemia in chronic kidney disease: Secondary | ICD-10-CM | POA: Diagnosis not present

## 2024-02-18 DIAGNOSIS — Z885 Allergy status to narcotic agent status: Secondary | ICD-10-CM | POA: Diagnosis not present

## 2024-02-18 DIAGNOSIS — Z882 Allergy status to sulfonamides status: Secondary | ICD-10-CM | POA: Diagnosis not present

## 2024-02-18 DIAGNOSIS — N170 Acute kidney failure with tubular necrosis: Secondary | ICD-10-CM | POA: Diagnosis not present

## 2024-02-18 DIAGNOSIS — E8770 Fluid overload, unspecified: Secondary | ICD-10-CM | POA: Diagnosis not present

## 2024-02-18 DIAGNOSIS — N179 Acute kidney failure, unspecified: Secondary | ICD-10-CM | POA: Diagnosis not present

## 2024-02-18 DIAGNOSIS — Z6839 Body mass index (BMI) 39.0-39.9, adult: Secondary | ICD-10-CM | POA: Diagnosis not present

## 2024-02-18 DIAGNOSIS — Z880 Allergy status to penicillin: Secondary | ICD-10-CM | POA: Diagnosis not present

## 2024-02-18 DIAGNOSIS — I161 Hypertensive emergency: Secondary | ICD-10-CM | POA: Diagnosis not present

## 2024-02-18 DIAGNOSIS — R600 Localized edema: Secondary | ICD-10-CM | POA: Diagnosis not present

## 2024-02-18 DIAGNOSIS — Z8673 Personal history of transient ischemic attack (TIA), and cerebral infarction without residual deficits: Secondary | ICD-10-CM | POA: Diagnosis not present

## 2024-02-18 DIAGNOSIS — D649 Anemia, unspecified: Secondary | ICD-10-CM | POA: Diagnosis not present

## 2024-02-18 DIAGNOSIS — I252 Old myocardial infarction: Secondary | ICD-10-CM | POA: Diagnosis not present

## 2024-02-18 DIAGNOSIS — N189 Chronic kidney disease, unspecified: Secondary | ICD-10-CM | POA: Diagnosis not present

## 2024-02-18 DIAGNOSIS — F32A Depression, unspecified: Secondary | ICD-10-CM | POA: Diagnosis not present

## 2024-02-18 DIAGNOSIS — M7989 Other specified soft tissue disorders: Secondary | ICD-10-CM | POA: Diagnosis not present

## 2024-02-18 DIAGNOSIS — N186 End stage renal disease: Secondary | ICD-10-CM | POA: Diagnosis not present

## 2024-02-18 DIAGNOSIS — E1122 Type 2 diabetes mellitus with diabetic chronic kidney disease: Secondary | ICD-10-CM | POA: Diagnosis not present

## 2024-02-18 DIAGNOSIS — Z91040 Latex allergy status: Secondary | ICD-10-CM | POA: Diagnosis not present

## 2024-02-18 DIAGNOSIS — I10 Essential (primary) hypertension: Secondary | ICD-10-CM | POA: Diagnosis not present

## 2024-02-18 DIAGNOSIS — J449 Chronic obstructive pulmonary disease, unspecified: Secondary | ICD-10-CM | POA: Diagnosis not present

## 2024-02-18 DIAGNOSIS — M79604 Pain in right leg: Secondary | ICD-10-CM | POA: Diagnosis not present

## 2024-02-18 DIAGNOSIS — Z87442 Personal history of urinary calculi: Secondary | ICD-10-CM | POA: Diagnosis not present

## 2024-02-18 DIAGNOSIS — E1165 Type 2 diabetes mellitus with hyperglycemia: Secondary | ICD-10-CM | POA: Diagnosis not present

## 2024-02-19 DIAGNOSIS — N179 Acute kidney failure, unspecified: Secondary | ICD-10-CM | POA: Diagnosis not present

## 2024-02-19 DIAGNOSIS — I509 Heart failure, unspecified: Secondary | ICD-10-CM | POA: Diagnosis not present

## 2024-02-19 DIAGNOSIS — E876 Hypokalemia: Secondary | ICD-10-CM | POA: Diagnosis not present

## 2024-02-28 DIAGNOSIS — R000 Tachycardia, unspecified: Secondary | ICD-10-CM | POA: Diagnosis not present

## 2024-02-28 DIAGNOSIS — I10 Essential (primary) hypertension: Secondary | ICD-10-CM | POA: Diagnosis not present

## 2024-02-28 DIAGNOSIS — R079 Chest pain, unspecified: Secondary | ICD-10-CM | POA: Diagnosis not present

## 2024-02-28 DIAGNOSIS — I161 Hypertensive emergency: Secondary | ICD-10-CM | POA: Diagnosis not present

## 2024-02-28 DIAGNOSIS — N19 Unspecified kidney failure: Secondary | ICD-10-CM | POA: Diagnosis not present

## 2024-02-28 DIAGNOSIS — N179 Acute kidney failure, unspecified: Secondary | ICD-10-CM | POA: Diagnosis not present

## 2024-02-28 DIAGNOSIS — R609 Edema, unspecified: Secondary | ICD-10-CM | POA: Diagnosis not present

## 2024-02-29 DIAGNOSIS — N184 Chronic kidney disease, stage 4 (severe): Secondary | ICD-10-CM | POA: Diagnosis not present

## 2024-02-29 DIAGNOSIS — E876 Hypokalemia: Secondary | ICD-10-CM | POA: Diagnosis not present

## 2024-02-29 DIAGNOSIS — N179 Acute kidney failure, unspecified: Secondary | ICD-10-CM | POA: Diagnosis not present

## 2024-02-29 DIAGNOSIS — I161 Hypertensive emergency: Secondary | ICD-10-CM | POA: Diagnosis not present

## 2024-02-29 DIAGNOSIS — I509 Heart failure, unspecified: Secondary | ICD-10-CM | POA: Diagnosis not present

## 2024-02-29 DIAGNOSIS — I1310 Hypertensive heart and chronic kidney disease without heart failure, with stage 1 through stage 4 chronic kidney disease, or unspecified chronic kidney disease: Secondary | ICD-10-CM | POA: Diagnosis not present

## 2024-03-01 DIAGNOSIS — E876 Hypokalemia: Secondary | ICD-10-CM | POA: Diagnosis not present

## 2024-03-01 DIAGNOSIS — N179 Acute kidney failure, unspecified: Secondary | ICD-10-CM | POA: Diagnosis not present

## 2024-03-01 DIAGNOSIS — I509 Heart failure, unspecified: Secondary | ICD-10-CM | POA: Diagnosis not present

## 2024-03-01 DIAGNOSIS — E1169 Type 2 diabetes mellitus with other specified complication: Secondary | ICD-10-CM | POA: Diagnosis not present

## 2024-03-01 DIAGNOSIS — I161 Hypertensive emergency: Secondary | ICD-10-CM | POA: Diagnosis not present

## 2024-03-01 DIAGNOSIS — I120 Hypertensive chronic kidney disease with stage 5 chronic kidney disease or end stage renal disease: Secondary | ICD-10-CM | POA: Diagnosis not present

## 2024-03-01 DIAGNOSIS — N186 End stage renal disease: Secondary | ICD-10-CM | POA: Diagnosis not present

## 2024-03-01 DIAGNOSIS — I5030 Unspecified diastolic (congestive) heart failure: Secondary | ICD-10-CM | POA: Diagnosis not present

## 2024-03-01 DIAGNOSIS — N184 Chronic kidney disease, stage 4 (severe): Secondary | ICD-10-CM | POA: Diagnosis not present

## 2024-03-02 DIAGNOSIS — E8770 Fluid overload, unspecified: Secondary | ICD-10-CM | POA: Diagnosis not present

## 2024-03-02 DIAGNOSIS — I161 Hypertensive emergency: Secondary | ICD-10-CM | POA: Diagnosis not present

## 2024-03-02 DIAGNOSIS — E876 Hypokalemia: Secondary | ICD-10-CM | POA: Diagnosis not present

## 2024-03-02 DIAGNOSIS — I5030 Unspecified diastolic (congestive) heart failure: Secondary | ICD-10-CM | POA: Diagnosis not present

## 2024-03-02 DIAGNOSIS — I509 Heart failure, unspecified: Secondary | ICD-10-CM | POA: Diagnosis not present

## 2024-03-02 DIAGNOSIS — N179 Acute kidney failure, unspecified: Secondary | ICD-10-CM | POA: Diagnosis not present

## 2024-03-03 DIAGNOSIS — E8770 Fluid overload, unspecified: Secondary | ICD-10-CM | POA: Diagnosis not present

## 2024-03-03 DIAGNOSIS — E876 Hypokalemia: Secondary | ICD-10-CM | POA: Diagnosis not present

## 2024-03-03 DIAGNOSIS — I161 Hypertensive emergency: Secondary | ICD-10-CM | POA: Diagnosis not present

## 2024-03-03 DIAGNOSIS — N19 Unspecified kidney failure: Secondary | ICD-10-CM | POA: Diagnosis not present

## 2024-03-03 DIAGNOSIS — I509 Heart failure, unspecified: Secondary | ICD-10-CM | POA: Diagnosis not present

## 2024-03-03 DIAGNOSIS — N179 Acute kidney failure, unspecified: Secondary | ICD-10-CM | POA: Diagnosis not present

## 2024-03-03 DIAGNOSIS — E1140 Type 2 diabetes mellitus with diabetic neuropathy, unspecified: Secondary | ICD-10-CM | POA: Diagnosis not present

## 2024-03-04 DIAGNOSIS — I509 Heart failure, unspecified: Secondary | ICD-10-CM | POA: Diagnosis not present

## 2024-03-04 DIAGNOSIS — I161 Hypertensive emergency: Secondary | ICD-10-CM | POA: Diagnosis not present

## 2024-03-04 DIAGNOSIS — E876 Hypokalemia: Secondary | ICD-10-CM | POA: Diagnosis not present

## 2024-03-04 DIAGNOSIS — N179 Acute kidney failure, unspecified: Secondary | ICD-10-CM | POA: Diagnosis not present

## 2024-03-04 DIAGNOSIS — E1140 Type 2 diabetes mellitus with diabetic neuropathy, unspecified: Secondary | ICD-10-CM | POA: Diagnosis not present

## 2024-03-04 DIAGNOSIS — N19 Unspecified kidney failure: Secondary | ICD-10-CM | POA: Diagnosis not present

## 2024-03-04 DIAGNOSIS — E8770 Fluid overload, unspecified: Secondary | ICD-10-CM | POA: Diagnosis not present

## 2024-03-05 DIAGNOSIS — N179 Acute kidney failure, unspecified: Secondary | ICD-10-CM | POA: Diagnosis not present

## 2024-03-05 DIAGNOSIS — I509 Heart failure, unspecified: Secondary | ICD-10-CM | POA: Diagnosis not present

## 2024-03-05 DIAGNOSIS — N189 Chronic kidney disease, unspecified: Secondary | ICD-10-CM | POA: Diagnosis not present

## 2024-03-05 DIAGNOSIS — I161 Hypertensive emergency: Secondary | ICD-10-CM | POA: Diagnosis not present

## 2024-03-05 DIAGNOSIS — E876 Hypokalemia: Secondary | ICD-10-CM | POA: Diagnosis not present

## 2024-03-06 DIAGNOSIS — R112 Nausea with vomiting, unspecified: Secondary | ICD-10-CM | POA: Diagnosis not present

## 2024-03-06 DIAGNOSIS — N179 Acute kidney failure, unspecified: Secondary | ICD-10-CM | POA: Diagnosis not present

## 2024-03-06 DIAGNOSIS — N19 Unspecified kidney failure: Secondary | ICD-10-CM | POA: Diagnosis not present

## 2024-03-06 DIAGNOSIS — E8770 Fluid overload, unspecified: Secondary | ICD-10-CM | POA: Diagnosis not present

## 2024-03-06 DIAGNOSIS — R109 Unspecified abdominal pain: Secondary | ICD-10-CM | POA: Diagnosis not present

## 2024-03-06 DIAGNOSIS — I509 Heart failure, unspecified: Secondary | ICD-10-CM | POA: Diagnosis not present

## 2024-03-06 DIAGNOSIS — E876 Hypokalemia: Secondary | ICD-10-CM | POA: Diagnosis not present

## 2024-03-07 DIAGNOSIS — R112 Nausea with vomiting, unspecified: Secondary | ICD-10-CM | POA: Diagnosis not present

## 2024-03-07 DIAGNOSIS — R109 Unspecified abdominal pain: Secondary | ICD-10-CM | POA: Diagnosis not present

## 2024-03-07 DIAGNOSIS — N19 Unspecified kidney failure: Secondary | ICD-10-CM | POA: Diagnosis not present

## 2024-03-07 DIAGNOSIS — E8770 Fluid overload, unspecified: Secondary | ICD-10-CM | POA: Diagnosis not present

## 2024-03-07 DIAGNOSIS — I509 Heart failure, unspecified: Secondary | ICD-10-CM | POA: Diagnosis not present

## 2024-03-07 DIAGNOSIS — N179 Acute kidney failure, unspecified: Secondary | ICD-10-CM | POA: Diagnosis not present

## 2024-03-07 DIAGNOSIS — E876 Hypokalemia: Secondary | ICD-10-CM | POA: Diagnosis not present

## 2024-03-08 DIAGNOSIS — N179 Acute kidney failure, unspecified: Secondary | ICD-10-CM | POA: Diagnosis not present

## 2024-03-08 DIAGNOSIS — N189 Chronic kidney disease, unspecified: Secondary | ICD-10-CM | POA: Diagnosis not present

## 2024-03-08 DIAGNOSIS — I509 Heart failure, unspecified: Secondary | ICD-10-CM | POA: Diagnosis not present

## 2024-03-08 DIAGNOSIS — I161 Hypertensive emergency: Secondary | ICD-10-CM | POA: Diagnosis not present

## 2024-03-08 DIAGNOSIS — E876 Hypokalemia: Secondary | ICD-10-CM | POA: Diagnosis not present

## 2024-03-08 DIAGNOSIS — R112 Nausea with vomiting, unspecified: Secondary | ICD-10-CM | POA: Diagnosis not present

## 2024-03-09 DIAGNOSIS — N19 Unspecified kidney failure: Secondary | ICD-10-CM | POA: Diagnosis not present

## 2024-03-09 DIAGNOSIS — R112 Nausea with vomiting, unspecified: Secondary | ICD-10-CM | POA: Diagnosis not present

## 2024-03-09 DIAGNOSIS — R109 Unspecified abdominal pain: Secondary | ICD-10-CM | POA: Diagnosis not present

## 2024-03-09 DIAGNOSIS — N179 Acute kidney failure, unspecified: Secondary | ICD-10-CM | POA: Diagnosis not present

## 2024-03-09 DIAGNOSIS — E8770 Fluid overload, unspecified: Secondary | ICD-10-CM | POA: Diagnosis not present

## 2024-03-09 DIAGNOSIS — I509 Heart failure, unspecified: Secondary | ICD-10-CM | POA: Diagnosis not present

## 2024-03-09 DIAGNOSIS — E876 Hypokalemia: Secondary | ICD-10-CM | POA: Diagnosis not present

## 2024-03-10 DIAGNOSIS — R112 Nausea with vomiting, unspecified: Secondary | ICD-10-CM | POA: Diagnosis not present

## 2024-03-10 DIAGNOSIS — N19 Unspecified kidney failure: Secondary | ICD-10-CM | POA: Diagnosis not present

## 2024-03-10 DIAGNOSIS — E8770 Fluid overload, unspecified: Secondary | ICD-10-CM | POA: Diagnosis not present

## 2024-03-10 DIAGNOSIS — E876 Hypokalemia: Secondary | ICD-10-CM | POA: Diagnosis not present

## 2024-03-10 DIAGNOSIS — R109 Unspecified abdominal pain: Secondary | ICD-10-CM | POA: Diagnosis not present

## 2024-03-10 DIAGNOSIS — I509 Heart failure, unspecified: Secondary | ICD-10-CM | POA: Diagnosis not present

## 2024-03-10 DIAGNOSIS — N179 Acute kidney failure, unspecified: Secondary | ICD-10-CM | POA: Diagnosis not present

## 2024-03-11 DIAGNOSIS — I509 Heart failure, unspecified: Secondary | ICD-10-CM | POA: Diagnosis not present

## 2024-03-11 DIAGNOSIS — I5031 Acute diastolic (congestive) heart failure: Secondary | ICD-10-CM | POA: Diagnosis not present

## 2024-03-11 DIAGNOSIS — N179 Acute kidney failure, unspecified: Secondary | ICD-10-CM | POA: Diagnosis not present

## 2024-03-11 DIAGNOSIS — I161 Hypertensive emergency: Secondary | ICD-10-CM | POA: Diagnosis not present

## 2024-03-11 DIAGNOSIS — N189 Chronic kidney disease, unspecified: Secondary | ICD-10-CM | POA: Diagnosis not present

## 2024-03-11 DIAGNOSIS — E876 Hypokalemia: Secondary | ICD-10-CM | POA: Diagnosis not present

## 2024-03-11 DIAGNOSIS — E1169 Type 2 diabetes mellitus with other specified complication: Secondary | ICD-10-CM | POA: Diagnosis not present

## 2024-03-12 DIAGNOSIS — E876 Hypokalemia: Secondary | ICD-10-CM | POA: Diagnosis not present

## 2024-03-12 DIAGNOSIS — I509 Heart failure, unspecified: Secondary | ICD-10-CM | POA: Diagnosis not present

## 2024-03-12 DIAGNOSIS — N179 Acute kidney failure, unspecified: Secondary | ICD-10-CM | POA: Diagnosis not present

## 2024-04-02 ENCOUNTER — Ambulatory Visit: Admitting: Internal Medicine

## 2024-04-20 NOTE — Progress Notes (Signed)
 TESHARA MOREE                                          MRN: [id number]   04/20/2024   The [facility] Quality Team Specialist reviewed this patient medical record for the purposes of chart review for care gap closure. The following were reviewed: chart review for care gap closure-controlling blood pressure and kidney health evaluation for diabetes:eGFR  and uACR.    [facility] Quality Team
